# Patient Record
Sex: Male | Born: 1965 | Race: White | Hispanic: No | Marital: Married
Health system: Southern US, Community
[De-identification: ages and names within clinical notes are randomized; demographics above are authoritative.]

## PROBLEM LIST (undated history)

## (undated) DIAGNOSIS — G4700 Insomnia, unspecified: Secondary | ICD-10-CM

## (undated) DIAGNOSIS — G4733 Obstructive sleep apnea (adult) (pediatric): Secondary | ICD-10-CM

## (undated) DIAGNOSIS — F411 Generalized anxiety disorder: Secondary | ICD-10-CM

## (undated) DIAGNOSIS — I10 Essential (primary) hypertension: Secondary | ICD-10-CM

## (undated) DIAGNOSIS — D179 Benign lipomatous neoplasm, unspecified: Secondary | ICD-10-CM

## (undated) HISTORY — DX: Benign lipomatous neoplasm, unspecified: D17.9

## (undated) HISTORY — DX: Generalized anxiety disorder: F41.1

## (undated) HISTORY — DX: Obstructive sleep apnea (adult) (pediatric): G47.33

## (undated) HISTORY — DX: Insomnia, unspecified: G47.00

---

## 2015-08-20 ENCOUNTER — Ambulatory Visit: Payer: Self-pay | Admitting: Cardiovascular Disease

## 2016-06-02 DIAGNOSIS — Z125 Encounter for screening for malignant neoplasm of prostate: Secondary | ICD-10-CM | POA: Diagnosis not present

## 2016-06-02 DIAGNOSIS — I10 Essential (primary) hypertension: Secondary | ICD-10-CM | POA: Diagnosis not present

## 2016-06-02 DIAGNOSIS — R739 Hyperglycemia, unspecified: Secondary | ICD-10-CM | POA: Diagnosis not present

## 2016-06-02 DIAGNOSIS — Z5181 Encounter for therapeutic drug level monitoring: Secondary | ICD-10-CM | POA: Diagnosis not present

## 2016-06-02 DIAGNOSIS — E782 Mixed hyperlipidemia: Secondary | ICD-10-CM | POA: Diagnosis not present

## 2017-06-11 DIAGNOSIS — Z125 Encounter for screening for malignant neoplasm of prostate: Secondary | ICD-10-CM | POA: Diagnosis not present

## 2017-06-11 DIAGNOSIS — Z0000 Encounter for general adult medical examination without abnormal findings: Secondary | ICD-10-CM | POA: Diagnosis not present

## 2017-06-11 DIAGNOSIS — R739 Hyperglycemia, unspecified: Secondary | ICD-10-CM | POA: Diagnosis not present

## 2017-06-11 DIAGNOSIS — I10 Essential (primary) hypertension: Secondary | ICD-10-CM | POA: Diagnosis not present

## 2017-06-11 DIAGNOSIS — E782 Mixed hyperlipidemia: Secondary | ICD-10-CM | POA: Diagnosis not present

## 2017-06-27 DIAGNOSIS — Z1211 Encounter for screening for malignant neoplasm of colon: Secondary | ICD-10-CM | POA: Diagnosis not present

## 2017-07-11 DIAGNOSIS — F411 Generalized anxiety disorder: Secondary | ICD-10-CM | POA: Diagnosis not present

## 2017-12-18 ENCOUNTER — Ambulatory Visit: Payer: Self-pay | Admitting: Medical

## 2017-12-18 ENCOUNTER — Encounter: Payer: Self-pay | Admitting: Medical

## 2017-12-18 VITALS — BP 140/92 | HR 72 | Temp 98.1°F

## 2017-12-18 DIAGNOSIS — I10 Essential (primary) hypertension: Secondary | ICD-10-CM

## 2017-12-18 NOTE — Progress Notes (Signed)
   Subjective:    Patient ID: Robert Hanson, male    DOB: [birth year], 52 y.o.   MRN: [id number]  HPI Taking metoprolol and Lorrin Mais He started  taking a new pill of  differetnt color of his blood pressure medication. He is not sure of the exact name of the medication the history says he is on Metoprolol -HCTZ 50-25 but he does not recall the HCTZ. He says he picked up his medication from his pharmacy and that the pill was a different color, which he was told by the pharmacist that day to expect the change. Since being on the new pill medication, he does not feel right  " like my blood pressure is elevated" and "my fingers feel swollen"  . Today  His bp in clinic was  142/94.denies any sshortness of breath or chest pain, dizziness, visual changes or syncope.    Feels his blood presssure was 130/90 was at his last doctors appointment Refill by [facility] physicians by Robert Hanson, Last saw doctor about 6 months ago.  Stopped Zoloft 6 weeks ago on his own, "I felt it was not working".. Review of Systems  Eyes: Negative for visual disturbance.  Respiratory: Negative for chest tightness and shortness of breath.   Cardiovascular: Negative for chest pain and leg swelling (swelling of his fingers " he could not get his wedding band on today").  Neurological: Negative for dizziness, syncope, light-headedness and headaches.       Objective:   Physical Exam  Constitutional: He is oriented to person, place, and time. He appears well-developed and well-nourished.  HENT:  Head: Normocephalic and atraumatic.  Cardiovascular: Normal rate, regular rhythm and normal heart sounds.  Pulmonary/Chest: Effort normal and breath sounds normal.  Neurological: He is alert and oriented to person, place, and time.  Skin: Skin is warm and dry.  Psychiatric: He has a normal mood and affect. His behavior is normal. Judgment and thought content normal.  Nursing note and vitals reviewed. I cannot appreciate any swelling in  his hands.   Recheck 140/92. Assessment & Plan:  Hypertension, change in pill manufacturer. I called the pharmacist to make sure there had been no change in his medication dose or that the HCTZ was added as a new medication regimen. Robert Hanson at his pharmacy said he had been on the Metoprolol- HCTZ since 2017. That sometimes patients cannot tolerate a change in manufacturer, she recommended he come back to the pharmacy and see if they could get the original manufacturer.  I communicated this to [facility]. Or the other plan is to see his family doctor and discuss his medication with them. He said he did not want to go to the pharmacy because he did not want it to seem like he was complaining , I told him it was the pharmacist who recommended getting the old manufacturer pills.  He says he will folllow up with his family doctor. Recheck on blood pressure is  140/92.

## 2017-12-18 NOTE — Patient Instructions (Signed)
Patient left without seeing me after his second blood pressure recheck.

## 2018-03-01 DIAGNOSIS — H6981 Other specified disorders of Eustachian tube, right ear: Secondary | ICD-10-CM | POA: Diagnosis not present

## 2018-03-01 DIAGNOSIS — H6591 Unspecified nonsuppurative otitis media, right ear: Secondary | ICD-10-CM | POA: Diagnosis not present

## 2018-03-19 DIAGNOSIS — K625 Hemorrhage of anus and rectum: Secondary | ICD-10-CM | POA: Diagnosis not present

## 2018-03-19 DIAGNOSIS — R634 Abnormal weight loss: Secondary | ICD-10-CM | POA: Diagnosis not present

## 2018-03-19 DIAGNOSIS — R197 Diarrhea, unspecified: Secondary | ICD-10-CM | POA: Diagnosis not present

## 2018-05-02 ENCOUNTER — Ambulatory Visit: Payer: Self-pay | Admitting: Adult Health

## 2018-05-02 ENCOUNTER — Encounter: Payer: Self-pay | Admitting: Adult Health

## 2018-05-02 VITALS — BP 151/96 | HR 61 | Temp 97.9°F | Resp 16 | Ht 73.0 in | Wt 197.0 lb

## 2018-05-02 DIAGNOSIS — H6501 Acute serous otitis media, right ear: Secondary | ICD-10-CM

## 2018-05-02 DIAGNOSIS — H6983 Other specified disorders of Eustachian tube, bilateral: Secondary | ICD-10-CM

## 2018-05-02 DIAGNOSIS — H6993 Unspecified Eustachian tube disorder, bilateral: Secondary | ICD-10-CM

## 2018-05-02 DIAGNOSIS — H9311 Tinnitus, right ear: Secondary | ICD-10-CM

## 2018-05-02 DIAGNOSIS — H60501 Unspecified acute noninfective otitis externa, right ear: Secondary | ICD-10-CM

## 2018-05-02 MED ORDER — AMOXICILLIN-POT CLAVULANATE 875-125 MG PO TABS: 1.0000 | ORAL_TABLET | Freq: Two times a day (BID) | ORAL | 0 refills | Status: DC

## 2018-05-02 MED ORDER — NEOMYCIN-POLYMYXIN-HC 3.5-10000-1 OT SOLN: 3.0000 [drp] | Freq: Four times a day (QID) | OTIC | 0 refills | Status: DC

## 2018-05-02 MED ORDER — PREDNISONE 10 MG (21) PO TBPK: ORAL_TABLET | ORAL | 0 refills | Status: DC

## 2018-05-02 NOTE — Patient Instructions (Signed)
Prednisolone tablets What is this medicine? PREDNISOLONE (pred NISS oh lone) is a corticosteroid. It is commonly used to treat inflammation of the skin, joints, lungs, and other organs. Common conditions treated include asthma, allergies, and arthritis. It is also used for other conditions, such as blood disorders and diseases of the adrenal glands. This medicine may be used for other purposes; ask your health care provider or pharmacist if you have questions. COMMON BRAND NAME(S): Millipred, Millipred DP, Millipred DP 12-Day, Millipred DP 6 Day, Prednoral What should I tell my health care provider before I take this medicine? They need to know if you have any of these conditions: -Cushing's syndrome -diabetes -glaucoma -heart problems or disease -high blood pressure -infection such as herpes, measles, tuberculosis, or chickenpox -kidney disease -liver disease -mental problems -myasthenia gravis -osteoporosis -seizures -stomach ulcer or intestine disease including colitis and diverticulitis -thyroid problem -an unusual or allergic reaction to lactose, prednisolone, other medicines, foods, dyes, or preservatives -pregnant or trying to get pregnant -breast-feeding How should I use this medicine? Take this medicine by mouth with a glass of water. Follow the directions on the prescription label. Take it with food or milk to avoid stomach upset. If you are taking this medicine once a day, take it in the morning. Do not take more medicine than you are told to take. Do not suddenly stop taking your medicine because you may develop a severe reaction. Your doctor will tell you how much medicine to take. If your doctor wants you to stop the medicine, the dose may be slowly lowered over time to avoid any side effects. Talk to your pediatrician regarding the use of this medicine in children. Special care may be needed. Overdosage: If you think you have taken too much of this medicine contact a poison  control center or emergency room at once. NOTE: This medicine is only for you. Do not share this medicine with others. What if I miss a dose? If you miss a dose, take it as soon as you can. If it is almost time for your next dose, take only that dose. Do not take double or extra doses. What may interact with this medicine? Do not take this medicine with any of the following medications: -metyrapone -mifepristone This medicine may also interact with the following medications: -aminoglutethimide -amphotericin B -aspirin and aspirin-like medicines -barbiturates -certain medicines for diabetes, like glipizide or glyburide -cholestyramine -cholinesterase inhibitors -cyclosporine -digoxin -diuretics -ephedrine -male hormones, like estrogens and birth control pills -isoniazid -ketoconazole -NSAIDS, medicines for pain and inflammation, like ibuprofen or naproxen -phenytoin -rifampin -toxoids -vaccines -warfarin This list may not describe all possible interactions. Give your health care provider a list of all the medicines, herbs, non-prescription drugs, or dietary supplements you use. Also tell them if you smoke, drink alcohol, or use illegal drugs. Some items may interact with your medicine. What should I watch for while using this medicine? Visit your doctor or health care professional for regular checks on your progress. If you are taking this medicine over a prolonged period, carry an identification card with your name and address, the type and dose of your medicine, and your doctor's name and address. This medicine may increase your risk of getting an infection. Tell your doctor or health care professional if you are around anyone with measles or chickenpox, or if you develop sores or blisters that do not heal properly. If you are going to have surgery, tell your doctor or health care professional that you have taken  this medicine within the last twelve months. Ask your doctor or  health care professional about your diet. You may need to lower the amount of salt you eat. This medicine may affect blood sugar levels. If you have diabetes, check with your doctor or health care professional before you change your diet or the dose of your diabetic medicine. What side effects may I notice from receiving this medicine? Side effects that you should report to your doctor or health care professional as soon as possible: -allergic reactions like skin rash, itching or hives, swelling of the face, lips, or tongue -changes in emotions or moods -changes in vision -eye pain -signs and symptoms of high blood sugar such as dizziness; dry mouth; dry skin; fruity breath; nausea; stomach pain; increased hunger or thirst; increased urination -signs and symptoms of infection like fever or chills; cough; sore throat; pain or trouble passing urine -slow growth in children (if used for longer periods of time) -swelling of ankles, feet -trouble sleeping -unusually weak or tired -weak bones (if used for longer periods of time) Side effects that usually do not require medical attention (report to your doctor or health care professional if they continue or are bothersome): -increased hunger -nausea -skin problems, acne, thin and shiny skin -upset stomach -weight gain This list may not describe all possible side effects. Call your doctor for medical advice about side effects. You may report side effects to FDA at 1-800-FDA-1088. Where should I keep my medicine? Keep out of the reach of children. Store at room temperature between 15 and 30 degrees C (59 and 86 degrees F). Keep container tightly closed. Throw away any unused medicine after the expiration date. NOTE: This sheet is a summary. It may not cover all possible information. If you have questions about this medicine, talk to your doctor, pharmacist, or health care provider.  2018 Elsevier/Gold Standard (2015-08-19  12:30:30) Tinnitus Tinnitus refers to hearing a sound when there is no actual source for that sound. This is often described as ringing in the ears. However, people with this condition may hear a variety of noises. A person may hear the sound in one ear or in both ears. The sounds of tinnitus can be soft, loud, or somewhere in between. Tinnitus can last for a few seconds or can be constant for days. It may go away without treatment and come back at various times. When tinnitus is constant or happens often, it can lead to other problems, such as trouble sleeping and trouble concentrating. Almost everyone experiences tinnitus at some point. Tinnitus that is long-lasting (chronic) or comes back often is a problem that may require medical attention. What are the causes? The cause of tinnitus is often not known. In some cases, it can result from other problems or conditions, including:  Exposure to loud noises from machinery, music, or other sources.  Hearing loss.  Ear or sinus infections.  Earwax buildup.  A foreign object in the ear.  Use of certain medicines.  Use of alcohol and caffeine.  High blood pressure.  Heart diseases.  Anemia.  Allergies.  Meniere disease.  Thyroid problems.  Tumors.  An enlarged part of a weakened blood vessel (aneurysm).  What are the signs or symptoms? The main symptom of tinnitus is hearing a sound when there is no source for that sound. It may sound like:  Buzzing.  Roaring.  Ringing.  Blowing air, similar to the sound heard when you listen to a seashell.  Hissing.  Whistling.  Sizzling.  Humming.  Running water.  A sustained musical note.  How is this diagnosed? Tinnitus is diagnosed based on your symptoms. Your health care provider will do a physical exam. A comprehensive hearing exam (audiologic exam) will be done if your tinnitus:  Affects only one ear (unilateral).  Causes hearing difficulties.  Lasts 6 months or  longer.  You may also need to see a health care provider who specializes in hearing disorders (audiologist). You may be asked to complete a questionnaire to determine the severity of your tinnitus. Tests may be done to help determine the cause and to rule out other conditions. These can include:  Imaging studies of your head and brain, such as: ? A CT scan. ? An MRI.  An imaging study of your blood vessels (angiogram).  How is this treated? Treating an underlying medical condition can sometimes make tinnitus go away. If your tinnitus continues, other treatments may include:  Medicines, such as certain antidepressants or sleeping aids.  Sound generators to mask the tinnitus. These include: ? Tabletop sound machines that play relaxing sounds to help you fall asleep. ? Wearable devices that fit in your ear and play sounds or music. ? A small device that uses headphones to deliver a signal embedded in music (acoustic neural stimulation). In time, this may change the pathways of your brain and make you less sensitive to tinnitus. This device is used for very severe cases when no other treatment is working.  Therapy and counseling to help you manage the stress of living with tinnitus.  Using hearing aids or cochlear implants, if your tinnitus is related to hearing loss.  Follow these instructions at home:  When possible, avoid being in loud places and being exposed to loud sounds.  Wear hearing protection, such as earplugs, when you are exposed to loud noises.  Do not take stimulants, such as nicotine, alcohol, or caffeine.  Practice techniques for reducing stress, such as meditation, yoga, or deep breathing.  Use a white noise machine, a humidifier, or other devices to mask the sound of tinnitus.  Sleep with your head slightly raised. This may reduce the impact of tinnitus.  Try to get plenty of rest each night. Contact a health care provider if:  You have tinnitus in just one  ear.  Your tinnitus continues for 3 weeks or longer without stopping.  Home care measures are not helping.  You have tinnitus after a head injury.  You have tinnitus along with any of the following: ? Dizziness. ? Loss of balance. ? Nausea and vomiting. This information is not intended to replace advice given to you by your health care provider. Make sure you discuss any questions you have with your health care provider. Document Released: 07/17/2005 Document Revised: 03/19/2016 Document Reviewed: 12/17/2013 Elsevier Interactive Patient Education  2018 [place]. Amoxicillin; Clavulanic Acid tablets What is this medicine? AMOXICILLIN; CLAVULANIC ACID (a mox i SIL in; KLAV yoo lan ic AS id) is a penicillin antibiotic. It is used to treat certain kinds of bacterial infections. It will not work for colds, flu, or other viral infections. This medicine may be used for other purposes; ask your health care provider or pharmacist if you have questions. COMMON BRAND NAME(S): Augmentin What should I tell my health care provider before I take this medicine? They need to know if you have any of these conditions: -bowel disease, like colitis -kidney disease -liver disease -mononucleosis -an unusual or allergic reaction to amoxicillin, penicillin, cephalosporin, other  antibiotics, clavulanic acid, other medicines, foods, dyes, or preservatives -pregnant or trying to get pregnant -breast-feeding How should I use this medicine? Take this medicine by mouth with a full glass of water. Follow the directions on the prescription label. Take at the start of a meal. Do not crush or chew. If the tablet has a score line, you may cut it in half at the score line for easier swallowing. Take your medicine at regular intervals. Do not take your medicine more often than directed. Take all of your medicine as directed even if you think you are better. Do not skip doses or stop your medicine early. Talk to your  pediatrician regarding the use of this medicine in children. Special care may be needed. Overdosage: If you think you have taken too much of this medicine contact a poison control center or emergency room at once. NOTE: This medicine is only for you. Do not share this medicine with others. What if I miss a dose? If you miss a dose, take it as soon as you can. If it is almost time for your next dose, take only that dose. Do not take double or extra doses. What may interact with this medicine? -allopurinol -anticoagulants -birth control pills -methotrexate -probenecid This list may not describe all possible interactions. Give your health care provider a list of all the medicines, herbs, non-prescription drugs, or dietary supplements you use. Also tell them if you smoke, drink alcohol, or use illegal drugs. Some items may interact with your medicine. What should I watch for while using this medicine? Tell your doctor or health care professional if your symptoms do not improve. Do not treat diarrhea with over the counter products. Contact your doctor if you have diarrhea that lasts more than 2 days or if it is severe and watery. If you have diabetes, you may get a false-positive result for sugar in your urine. Check with your doctor or health care professional. Birth control pills may not work properly while you are taking this medicine. Talk to your doctor about using an extra method of birth control. What side effects may I notice from receiving this medicine? Side effects that you should report to your doctor or health care professional as soon as possible: -allergic reactions like skin rash, itching or hives, swelling of the face, lips, or tongue -breathing problems -dark urine -fever or chills, sore throat -redness, blistering, peeling or loosening of the skin, including inside the mouth -seizures -trouble passing urine or change in the amount of urine -unusual bleeding,  bruising -unusually weak or tired -white patches or sores in the mouth or throat Side effects that usually do not require medical attention (report to your doctor or health care professional if they continue or are bothersome): -diarrhea -dizziness -headache -nausea, vomiting -stomach upset -vaginal or anal irritation This list may not describe all possible side effects. Call your doctor for medical advice about side effects. You may report side effects to FDA at 1-800-FDA-1088. Where should I keep my medicine? Keep out of the reach of children. Store at room temperature below 25 degrees C (77 degrees F). Keep container tightly closed. Throw away any unused medicine after the expiration date. NOTE: This sheet is a summary. It may not cover all possible information. If you have questions about this medicine, talk to your doctor, pharmacist, or health care provider.  2018 Elsevier/Gold Standard (2007-10-10 12:04:30)

## 2018-05-02 NOTE — Progress Notes (Signed)
Subjective:     Patient ID: Robert Hanson, male   DOB: 1966/03/12, 52 y.o.   MRN: [id number]  HPI  Blood pressure (!) 151/96, pulse 61, temperature 97.9 F (36.6 C), resp. rate 16, height 6\' 1"  (1.854 m), weight 197 lb (89.4 kg), SpO2 97 %. Recheck B/P 140/84 manual.   Patient is a 52 year old male in no acute distress who comes in to the clnic for tinniytus - he reports he was seen by his primary care doctor 6 weeks ago for tinnitus and was told he had fluid behind right ear. Her reports he was given Prednisone 40 mg for 5 days on 03/01/18 and reports symptoms never really resolved. He sees [place] physcians Maurice Small in [place] he reports.   Denies dizziness. Tinnitus in right ear only.  Denies any pain.  Denies any headaches.  He is using Flonase. He is not using an antihistamine.  Denies any loss of hearing.   Patient  denies any fever, body aches,chills, rash, chest pain, shortness of breath, nausea, vomiting, or diarrhea.    Review of Systems  Constitutional: Negative.   HENT: Positive for congestion, ear pain (right / ringing in right ear only ), rhinorrhea and tinnitus (right ear ). Negative for dental problem, drooling, ear discharge, facial swelling, hearing loss, mouth sores, nosebleeds, postnasal drip, sinus pressure, sinus pain, sneezing, sore throat, trouble swallowing and voice change.   Eyes: Negative.   Respiratory: Negative.   Cardiovascular: Negative.   Gastrointestinal: Negative.   Endocrine: Negative.   Genitourinary: Negative.   Musculoskeletal: Negative.   Skin: Negative.   Allergic/Immunologic: Positive for environmental allergies. Negative for food allergies and immunocompromised state.  Neurological: Negative.   Hematological: Negative.   Psychiatric/Behavioral: Negative.        Objective:   Physical Exam  Constitutional: He is oriented to person, place, and time. He appears well-developed and well-nourished. He is active.  HENT:  Head:  Normocephalic and atraumatic.  Right Ear: External ear normal. No tenderness. Tympanic membrane is not perforated and not erythematous. A middle ear effusion is present.  Left Ear: Hearing, external ear and ear canal normal. There is swelling (mild edema and erythema in lower canal.) and tenderness (mild tenderness with tragal pull ). No foreign bodies. Tympanic membrane is erythematous. Tympanic membrane is not perforated. A middle ear effusion (dark yellow brown fluid behind dull tympanic membrabe right ear) is present.  Nose: Nose normal.  Mouth/Throat: Oropharynx is clear and moist. No oropharyngeal exudate.  Eyes: Pupils are equal, round, and reactive to light. Conjunctivae and EOM are normal.  Neck: Trachea normal, normal range of motion, full passive range of motion without pain and phonation normal. Neck supple. Normal carotid pulses, no hepatojugular reflux and no JVD present. No tracheal tenderness present. Carotid bruit is not present. No Brudzinski's sign noted. No thyroid mass present.  Cardiovascular: Normal rate, regular rhythm, normal heart sounds, intact distal pulses and normal pulses. Exam reveals no gallop and no friction rub.  No murmur heard. Pulmonary/Chest: Effort normal and breath sounds normal. No stridor. No respiratory distress. He has no wheezes. He has no rales. He exhibits no tenderness.  Abdominal: Soft.  Lymphadenopathy:       Head (right side): No submental, no submandibular, no tonsillar, no preauricular, no posterior auricular and no occipital adenopathy present.       Head (left side): No submental, no submandibular, no tonsillar, no preauricular, no posterior auricular and no occipital adenopathy present.    He  has no cervical adenopathy.  Neurological: He is alert and oriented to person, place, and time. He has normal strength and normal reflexes. He displays normal reflexes. No cranial nerve deficit or sensory deficit. He exhibits normal muscle tone. He displays  a negative Romberg sign. Coordination normal. GCS eye subscore is 4. GCS verbal subscore is 5. GCS motor subscore is 6.  Patient is alert and oriented and responsive to questions Engages in eye contact with provider. Speaks in full sentences without any pauses without any shortness of breath or distress.   Patient moves on and off of exam table and in room without difficulty. Gait is normal in hall and in room. Patient is oriented to person place time and situation. Patient answers questions appropriately and engages in conversation.   Skin: Skin is warm and dry. Capillary refill takes less than 2 seconds. No rash noted. No erythema. No pallor.  Psychiatric: He has a normal mood and affect. His behavior is normal. Judgment and thought content normal.  Vitals reviewed.      Assessment:    Eustachian tube dysfunction, bilateral  Non-recurrent acute serous otitis media of right ear  Acute otitis externa of right ear, unspecified type  Tinnitus of right ear    Plan:     Recheck in two weeks sooner if worsens or does not improve. May need referral to Ear nose and throat. Symptoms should not persist and should completely resolve.  After visit summary reviewed with patient.   Monitor blood pressure at home and record/ follow up with record to primary care physician.Maurice Small, MD   Advised patient call the office or your primary care doctor for an appointment if no improvement within 72 hours or if any symptoms change or worsen at any time  Advised ER or urgent Care if after hours or on weekend. Call 911 for emergency symptoms at any time.Patinet verbalized understanding of all instructions given/reviewed and treatment plan and has no further questions or concerns at this time.    Patient verbalized understanding of all instructions given and denies any further questions at this time.

## 2018-06-26 DIAGNOSIS — R739 Hyperglycemia, unspecified: Secondary | ICD-10-CM | POA: Diagnosis not present

## 2018-06-26 DIAGNOSIS — Z0000 Encounter for general adult medical examination without abnormal findings: Secondary | ICD-10-CM | POA: Diagnosis not present

## 2018-06-26 DIAGNOSIS — Z125 Encounter for screening for malignant neoplasm of prostate: Secondary | ICD-10-CM | POA: Diagnosis not present

## 2018-06-26 DIAGNOSIS — E782 Mixed hyperlipidemia: Secondary | ICD-10-CM | POA: Diagnosis not present

## 2018-06-26 DIAGNOSIS — Z23 Encounter for immunization: Secondary | ICD-10-CM | POA: Diagnosis not present

## 2018-06-26 DIAGNOSIS — I10 Essential (primary) hypertension: Secondary | ICD-10-CM | POA: Diagnosis not present

## 2018-06-26 DIAGNOSIS — Z1211 Encounter for screening for malignant neoplasm of colon: Secondary | ICD-10-CM | POA: Diagnosis not present

## 2019-01-21 DIAGNOSIS — Z20828 Contact with and (suspected) exposure to other viral communicable diseases: Secondary | ICD-10-CM | POA: Diagnosis not present

## 2019-01-21 DIAGNOSIS — R05 Cough: Secondary | ICD-10-CM | POA: Diagnosis not present

## 2019-01-22 DIAGNOSIS — R05 Cough: Secondary | ICD-10-CM | POA: Diagnosis not present

## 2019-02-25 DIAGNOSIS — R05 Cough: Secondary | ICD-10-CM | POA: Diagnosis not present

## 2019-02-26 ENCOUNTER — Other Ambulatory Visit: Payer: Self-pay | Admitting: Family Medicine

## 2019-02-26 ENCOUNTER — Ambulatory Visit
Admission: RE | Admit: 2019-02-26 | Discharge: 2019-02-26 | Disposition: A | Discharge status: Home / Self Care | Payer: BC Managed Care – PPO | Source: Ambulatory Visit | Attending: Family Medicine | Admitting: Family Medicine

## 2019-02-26 DIAGNOSIS — R05 Cough: Secondary | ICD-10-CM | POA: Diagnosis not present

## 2019-02-26 DIAGNOSIS — R0600 Dyspnea, unspecified: Secondary | ICD-10-CM

## 2019-02-26 DIAGNOSIS — R0609 Other forms of dyspnea: Secondary | ICD-10-CM

## 2019-02-26 DIAGNOSIS — R0602 Shortness of breath: Secondary | ICD-10-CM | POA: Diagnosis not present

## 2019-03-06 ENCOUNTER — Other Ambulatory Visit: Payer: Self-pay

## 2019-03-06 ENCOUNTER — Ambulatory Visit (INDEPENDENT_AMBULATORY_CARE_PROVIDER_SITE_OTHER): Payer: BC Managed Care – PPO

## 2019-03-06 ENCOUNTER — Ambulatory Visit: Payer: BC Managed Care – PPO | Admitting: Internal Medicine

## 2019-03-06 ENCOUNTER — Encounter: Payer: Self-pay | Admitting: Internal Medicine

## 2019-03-06 DIAGNOSIS — R05 Cough: Secondary | ICD-10-CM | POA: Diagnosis not present

## 2019-03-06 DIAGNOSIS — R058 Other specified cough: Secondary | ICD-10-CM

## 2019-03-06 MED ORDER — PREDNISONE 10 MG PO TABS: ORAL_TABLET | ORAL | 0 refills | Status: DC

## 2019-03-06 MED ORDER — ACETAMINOPHEN-CODEINE #3 300-30 MG PO TABS: 1.0000 | ORAL_TABLET | ORAL | 0 refills | Status: DC | PRN

## 2019-03-06 NOTE — Progress Notes (Signed)
Robert Hanson, male    DOB: August 08, 1965,    MRN: [id number]   Brief patient profile:  53 yowm minimal smoking hx quit  2018  With h/o bad sinus infections onset or arrival 2003 to [place] better since started p rx flonase and chronic cough related to smoking that resolved 100% p quit then end of May 2020 started back coughing gagging / choking and vomiting with mucus white min prod > cxr suggested pna while augmentin > then changed to levaquin 02/28/2019  And referred to pulmonary clinic 03/06/2019 by Dr  Maurice Small      History of Present Illness  03/06/2019  Pulmonary/ 1st office eval/Sonia Stickels  Chief Complaint  Patient presents with  . Pulmonary Consult    Referred by Dr. Justin Mend for cough. Patient reports a productive cough with thick white sputum. He also reports that he has some wheezing at night when he lays down to go to sleep.   Dyspnea:  Walking the dog  Cough: wakes up usual time and then starts couging, worse again at  hs some better on rx  Sleep: does fine when asleep SABA use: proventil one puff every 4 hours  No obvious day to day or daytime variability or assoc excess/ purulent sputum or mucus plugs or hemoptysis or cp or chest tightness,  overt sinus or hb symptoms.   Sleeping flat and does fine once asleep  without nocturnal  or early am exacerbation  of respiratory  c/o's or need for noct saba. Also denies any obvious fluctuation of symptoms with weather or environmental changes or other aggravating or alleviating factors except as outlined above   No unusual exposure hx or h/o childhood pna/ asthma or knowledge of premature birth.  Current Allergies, Complete Past Medical History, Past Surgical History, Family History, and Social History were reviewed in [employer] record.  ROS  The following are not active complaints unless bolded Hoarseness, sore throat, dysphagia, dental problems, itching, sneezing,  nasal congestion or discharge of excess mucus or  purulent secretions, ear ache,   fever, chills, sweats, unintended wt loss or wt gain, classically pleuritic or exertional cp,  orthopnea pnd or arm/hand swelling  or leg swelling, presyncope, palpitations, abdominal pain, anorexia, nausea, vomiting, diarrhea  or change in bowel habits or change in bladder habits, change in stools or change in urine, dysuria, hematuria,  rash, arthralgias, visual complaints, headache, numbness, weakness or ataxia or problems with walking or coordination,  change in mood or  memory.           History reviewed. No pertinent past medical history.  Outpatient Medications Prior to Visit  Medication Sig Dispense Refill  . levofloxacin (LEVAQUIN) 500 MG tablet TAKE 1 TABLET BY MOUTH EVERY DAY FOR 10 DAYS    . metoprolol-hydrochlorothiazide (LOPRESSOR HCT) 50-25 MG tablet Take 1 tablet by mouth daily.  3  . sertraline (ZOLOFT) 100 MG tablet TAKE 1 AND 1/2 TABLETS BY MOUTH ONCE A DAY.    Marland Kitchen zolpidem (AMBIEN) 5 MG tablet 5 mg at bedtime.   4  .       . neomycin-polymyxin-hydrocortisone (CORTISPORIN) OTIC solution Place 3 drops into the right ear 4 (four) times daily. 10 mL 0  .     0      Objective:     BP 120/76 (BP Location: Left Arm, Cuff Size: Normal)   Pulse 66   Temp 97.7 F (36.5 C) (Oral)   Ht 6\' 1"  (1.854 m)  Wt 197 lb (89.4 kg)   SpO2 95%   BMI 25.99 kg/m   SpO2: 95 %  RA  amb wm nad   HEENT: nl dentition, turbinates bilaterally, and oropharynx. Nl external ear canals without cough reflex   NECK :  without JVD/Nodes/TM/ nl carotid upstrokes bilaterally   LUNGS: no acc muscle use,  Nl contour chest which is clear to A and P bilaterally without cough on insp or exp maneuvers   CV:  RRR  no s3 or murmur or increase in P2, and no edema   ABD:  soft and nontender with nl inspiratory excursion in the supine position. No bruits or organomegaly appreciated, bowel sounds nl  MS:  Nl gait/ ext warm without deformities, calf tenderness, cyanosis  or clubbing No obvious joint restrictions   SKIN: warm and dry without lesions    NEURO:  alert, approp, nl sensorium with  no motor or cerebellar deficits apparent.    CXR PA and Lateral:   03/06/2019 :    I personally reviewed images and agree with radiology impression as follows:   No active cardiopulmonary disease. My review:  I do not see convincing as dz on prior study 02/26/19 and suspect the scapula shadow is what is being seen esp on lateral view      Assessment   Upper airway cough syndrome Onset May 2020 cough so hards he gag/vomits  -  ? pna on cxr 02/26/19 rx augmenin then levaquin > 100% gone 03/07/2019 and suspect this was scapular shadow esp on lateral view - cyclical cough rx 04/02/2354 >>>  Certainly he has not had pna since May and have no problem with him finishing his abx but cough pattern is typical of Upper airway cough syndrome (previously labeled PNDS),  is so named because it's frequently impossible to sort out how much is  CR/sinusitis with freq throat clearing (which can be related to primary GERD)   vs  causing  secondary (" extra esophageal")  GERD from wide swings in gastric pressure that occur with throat clearing, often  promoting self use of mint and menthol lozenges that reduce the lower esophageal sphincter tone and exacerbate the problem further in a cyclical fashion(see below)    These are the same pts (now being labeled as having "irritable larynx syndrome" by some cough centers) who not infrequently have a history of having failed to tolerate ace inhibitors,  dry powder inhalers or biphosphonates or report having atypical/extraesophageal reflux symptoms that don't respond to standard doses of PPI  and are easily confused as having aecopd or asthma flares by even experienced allergists/ pulmonologists (myself included).    Re cyclical cough mentioned above:  Of the three most common causes of  Sub-acute / recurrent or chronic cough, only one (GERD)  can  actually contribute to/ trigger  the other two (asthma and post nasal drip syndrome)  and perpetuate the cylce of cough.  While not intuitively obvious, many patients with chronic low grade reflux do not cough until there is a primary insult that disturbs the protective epithelial barrier and exposes sensitive nerve endings.   This is typically viral but can due to PNDS and  either may apply here.      >>> The point is that once this occurs, it is difficult to eliminate the cycle  using anything but a maximally effective acid suppression regimen at least in the short run, accompanied by an appropriate diet to address non acid GERD and control /  eliminate the cough itself for at least 3 days with tylenol #3 plus given 6 days of Prednisone in case of component of Th-2 driven upper or lower airways inflammation (if cough responds short term only to relapse befor return while will on rx for uacs that would point to allergic rhinitis/ asthma or eos bronchitis)     >> f/u in 2 weeks with all meds in hand using a trust but verify approach to confirm accurate Medication  Reconciliation The principal here is that until we are certain that the  patients are doing what we've asked, it makes no sense to ask them to do more.    Total time devoted to counseling  > 50 % of initial 60 min office visit:  review case with pt/ discussion of options/alternatives/ personally creating written customized instructions  in presence of pt  then going over those specific  Instructions directly with the pt including how to use all of the meds but in particular covering each new medication in detail and the difference between the maintenance= "automatic" meds and the prns using an action plan format for the latter (If this problem/symptom => do that organization reading Left to right).  Please see AVS from this visit for a full list of these instructions which I personally wrote for this pt and  are unique to this visit.       Christinia Gully, MD 03/06/2019

## 2019-03-06 NOTE — Patient Instructions (Addendum)
Finish levaquin as you plan   Take delsym two tsp every 12 hours and supplement if needed with  Tylenol #3   up to 1-2 every 4 hours to suppress the urge to cough. Swallowing water and/or using ice chips/non mint and menthol containing candies (such as lifesavers or sugarless jolly ranchers) are also effective.  You should rest your voice and avoid activities that you know make you cough.  Once you have eliminated the cough for 3 straight days try reducing the Tylenol #3 first,  then the delsym as tolerated.     Prednisone 10 mg take  4 each am x 2 days,   2 each am x 2 days,  1 each am x 2 days and stop    Please remember to go to the lab and x-ray department   for your tests - we will call you with the results when they are available.     Please schedule a follow up office visit in 2 weeks, sooner if needed  with all medications /inhalers/ solutions in hand so we can verify exactly what you are taking. This includes all medications from all doctors and over the counters

## 2019-03-07 ENCOUNTER — Telehealth: Payer: Self-pay | Admitting: Internal Medicine

## 2019-03-07 ENCOUNTER — Encounter: Payer: Self-pay | Admitting: Internal Medicine

## 2019-03-07 LAB — CBC WITH DIFFERENTIAL/PLATELET
Basophils Absolute: 0.1 10*3/uL (ref 0.0–0.1)
Basophils Relative: 0.7 % (ref 0.0–3.0)
Eosinophils Absolute: 0.1 10*3/uL (ref 0.0–0.7)
Eosinophils Relative: 1.7 % (ref 0.0–5.0)
HCT: 49.5 % (ref 39.0–52.0)
Hemoglobin: 16.9 g/dL (ref 13.0–17.0)
Lymphocytes Relative: 22.3 % (ref 12.0–46.0)
Lymphs Abs: 1.8 10*3/uL (ref 0.7–4.0)
MCHC: 34.1 g/dL (ref 30.0–36.0)
MCV: 97.9 fl (ref 78.0–100.0)
Monocytes Absolute: 0.6 10*3/uL (ref 0.1–1.0)
Monocytes Relative: 7.8 % (ref 3.0–12.0)
Neutro Abs: 5.4 10*3/uL (ref 1.4–7.7)
Neutrophils Relative %: 67.5 % (ref 43.0–77.0)
Platelets: 192 10*3/uL (ref 150.0–400.0)
RBC: 5.05 Mil/uL (ref 4.22–5.81)
RDW: 14.1 % (ref 11.5–15.5)
WBC: 8 10*3/uL (ref 4.0–10.5)

## 2019-03-07 LAB — SEDIMENTATION RATE: Sed Rate: 6 mm/hr (ref 0–20)

## 2019-03-07 NOTE — Progress Notes (Signed)
ATC, NA and VM not set up yet

## 2019-03-07 NOTE — Assessment & Plan Note (Addendum)
Onset May 2020 cough so hards he gag/vomits  -  ? pna on cxr 02/26/19 rx augmenin then levaquin > 100% gone 03/07/2019 and suspect this was scapular shadow esp on lateral view - cyclical cough rx 0/03/3234 >>>  Certainly he has not had pna since May and have no problem with him finishing his abx but cough pattern is typical of Upper airway cough syndrome (previously labeled PNDS),  is so named because it's frequently impossible to sort out how much is  CR/sinusitis with freq throat clearing (which can be related to primary GERD)   vs  causing  secondary (" extra esophageal")  GERD from wide swings in gastric pressure that occur with throat clearing, often  promoting self use of mint and menthol lozenges that reduce the lower esophageal sphincter tone and exacerbate the problem further in a cyclical fashion(see below)    These are the same pts (now being labeled as having "irritable larynx syndrome" by some cough centers) who not infrequently have a history of having failed to tolerate ace inhibitors,  dry powder inhalers or biphosphonates or report having atypical/extraesophageal reflux symptoms that don't respond to standard doses of PPI  and are easily confused as having aecopd or asthma flares by even experienced allergists/ pulmonologists (myself included).    Re cyclical cough mentioned above:  Of the three most common causes of  Sub-acute / recurrent or chronic cough, only one (GERD)  can actually contribute to/ trigger  the other two (asthma and post nasal drip syndrome)  and perpetuate the cylce of cough.  While not intuitively obvious, many patients with chronic low grade reflux do not cough until there is a primary insult that disturbs the protective epithelial barrier and exposes sensitive nerve endings.   This is typically viral but can due to PNDS and  either may apply here.      >>> The point is that once this occurs, it is difficult to eliminate the cycle  using anything but a maximally  effective acid suppression regimen at least in the short run, accompanied by an appropriate diet to address non acid GERD and control / eliminate the cough itself for at least 3 days with tylenol #3 plus given 6 days of Prednisone in case of component of Th-2 driven upper or lower airways inflammation (if cough responds short term only to relapse befor return while will on rx for uacs that would point to allergic rhinitis/ asthma or eos bronchitis)     >> f/u in 2 weeks with all meds in hand using a trust but verify approach to confirm accurate Medication  Reconciliation The principal here is that until we are certain that the  patients are doing what we've asked, it makes no sense to ask them to do more.    Total time devoted to counseling  > 50 % of initial 60 min office visit:  review case with pt/ discussion of options/alternatives/ personally creating written customized instructions  in presence of pt  then going over those specific  Instructions directly with the pt including how to use all of the meds but in particular covering each new medication in detail and the difference between the maintenance= "automatic" meds and the prns using an action plan format for the latter (If this problem/symptom => do that organization reading Left to right).  Please see AVS from this visit for a full list of these instructions which I personally wrote for this pt and  are unique to this visit.

## 2019-03-07 NOTE — Telephone Encounter (Signed)
Notes recorded by Tanda Rockers, MD on 03/07/2019 at 5:41 AM EDT  Call pt: Reviewed cxr and no acute change so no change in recommendations made at [facility] with pt and notified of results per Dr. Melvyn Novas. Pt verbalized understanding and denied any questions.

## 2019-03-07 NOTE — Progress Notes (Signed)
Spoke with pt and notified of results per Dr. Wert. Pt verbalized understanding and denied any questions. 

## 2019-03-10 LAB — RESPIRATORY ALLERGY PROFILE REGION II ~~LOC~~
Allergen, A. alternata, m6: <0.1 kU/L
Allergen, Cedar tree, t12: <0.1 kU/L
Allergen, Comm Silver Birch, t9: <0.1 kU/L
Allergen, Cottonwood, t14: <0.1 kU/L
Allergen, D pternoyssinus,d7: <0.1 kU/L
Allergen, Mouse Urine Protein, e78: 0.49 kU/L — ABNORMAL HIGH
Allergen, Mulberry, t76: <0.1 kU/L
Allergen, Oak,t7: 0.3 kU/L — ABNORMAL HIGH
Allergen, P. notatum, m1: <0.1 kU/L
Aspergillus fumigatus, m3: <0.1 kU/L
Bermuda Grass: <0.1 kU/L
Box Elder IgE: <0.1 kU/L
CLADOSPORIUM HERBARUM (M2) IGE: <0.1 kU/L
COMMON RAGWEED (SHORT) (W1) IGE: <0.1 kU/L
Cat Dander: <0.1 kU/L
Class: 0
Class: 0
Class: 0
Class: 0
Class: 0
Class: 0
Class: 0
Class: 0
Class: 0
Class: 0
Class: 0
Class: 0
Class: 0
Class: 0
Class: 0
Class: 0
Class: 0
Class: 0
Class: 0
Class: 0
Class: 0
Class: 1
Class: 1
Class: 2
Cockroach: 0.33 kU/L — ABNORMAL HIGH
D. farinae: <0.1 kU/L
Dog Dander: 0.94 kU/L — ABNORMAL HIGH
Elm IgE: 0.38 kU/L — ABNORMAL HIGH
IgE (Immunoglobulin E), Serum: 421 kU/L — ABNORMAL HIGH (ref <=114)
Johnson Grass: <0.1 kU/L
Pecan/Hickory Tree IgE: <0.1 kU/L
Rough Pigweed  IgE: <0.1 kU/L
Sheep Sorrel IgE: <0.1 kU/L
Timothy Grass: <0.1 kU/L

## 2019-03-10 LAB — QUANTIFERON-TB GOLD PLUS
Mitogen-NIL: >10 IU/mL
NIL: 0.06 IU/mL
QuantiFERON-TB Gold Plus: NEGATIVE
TB1-NIL: <0 IU/mL
TB2-NIL: 0.01 IU/mL

## 2019-03-10 LAB — INTERPRETATION:

## 2019-03-10 NOTE — Progress Notes (Signed)
ATC, NA and no VM set up yet

## 2019-03-11 ENCOUNTER — Telehealth: Payer: Self-pay | Admitting: Internal Medicine

## 2019-03-11 NOTE — Telephone Encounter (Signed)
Called and spoke with Patient.  Dr Melvyn Novas results and recommendations given.  Understanding stated.  Patient scheduled with Dr Melvyn Novas 03/21/19, for follow up.  Nothing further at this time.

## 2019-03-21 ENCOUNTER — Ambulatory Visit: Payer: BC Managed Care – PPO | Admitting: Internal Medicine

## 2019-03-21 ENCOUNTER — Other Ambulatory Visit: Payer: Self-pay

## 2019-03-21 ENCOUNTER — Encounter: Payer: Self-pay | Admitting: Internal Medicine

## 2019-03-21 DIAGNOSIS — J3081 Allergic rhinitis due to animal (cat) (dog) hair and dander: Secondary | ICD-10-CM | POA: Diagnosis not present

## 2019-03-21 DIAGNOSIS — R05 Cough: Secondary | ICD-10-CM

## 2019-03-21 DIAGNOSIS — R058 Other specified cough: Secondary | ICD-10-CM

## 2019-03-21 NOTE — Patient Instructions (Signed)
Continue flonase at least one at bedtime and if condition worsens up 2 puffs every 12 hours as needed  Eustachian tube dysfunction    For drainage > allegra or clariton otc   If not happy I can call you in a trial of singulair 10 mg each pm    If still having trouble I recommend you see an allergist but in meantime should minimize exposure to the dog in the bedroom.   Follow up is as needed

## 2019-03-21 NOTE — Progress Notes (Addendum)
Robert Hanson, male    DOB: 05-25-1966,    MRN: [id number]   Brief patient profile:  53 yowm minimal smoking hx quit  2018  With h/o bad sinus infections onset or arrival 2003 to [place] better since started p rx flonase and chronic cough related to smoking that resolved 100% p quit then end of May 2020 started back coughing gagging / choking and vomiting with mucus white min prod > cxr suggested pna while augmentin > then changed to levaquin 02/28/2019  And referred to pulmonary clinic 03/06/2019 by Dr  Maurice Small      History of Present Illness  03/06/2019  Pulmonary/ 1st office eval/Roy Snuffer  Chief Complaint  Patient presents with  . Pulmonary Consult    Referred by Dr. Justin Mend for cough. Patient reports a productive cough with thick white sputum. He also reports that he has some wheezing at night when he lays down to go to sleep.   Dyspnea:  Walking the dog  Cough: wakes up usual time and then starts coughing, worse again at  hs some better on rx  Sleep: does fine when asleep SABA use: proventil one puff every 4 hours Rec Finish levaquin as you plan  Take delsym two tsp every 12 hours and supplement if needed with  Tylenol #3   up to 1-2 every 4 hours to suppress the urge to cough. Once you have eliminated the cough for 3 straight days try reducing the Tylenol #3 first,  then the delsym as tolerated.    Prednisone 10 mg take  4 each am x 2 days,   2 each am x 2 days,  1 each am x 2 days and stop  Please remember to go to the lab and x-ray department   for your tests - we will call you with the results when they are available.  Please schedule a follow up office visit in 2 weeks, sooner if needed  with all medications /inhalers/ solutions in hand so we can verify exactly what you are taking. This includes all medications from all doctors and over the counters    03/21/2019  f/u ov/Cleatus Goodin re: uacs/ allergic rhinitis  Chief Complaint  Patient presents with  . Follow-up    Patient reports  that he is doing great  Dyspnea:  Not limited by breathing from desired activities  / up and down steps, tennis Cough: gone Sleeping: ok  SABA use: no 02: no    No obvious day to day or daytime variability or assoc excess/ purulent sputum or mucus plugs or hemoptysis or cp or chest tightness, subjective wheeze or overt sinus or hb symptoms.   Sleeping  without nocturnal  or early am exacerbation  of respiratory  c/o's or need for noct saba. Also denies any obvious fluctuation of symptoms with weather or environmental changes or other aggravating or alleviating factors except as outlined above   No unusual exposure hx or h/o childhood pna/ asthma or knowledge of premature birth.  Current Allergies, Complete Past Medical History, Past Surgical History, Family History, and Social History were reviewed in [employer] record.  ROS  The following are not active complaints unless bolded Hoarseness, sore throat, dysphagia, dental problems, itching, sneezing,  nasal congestion or discharge of excess mucus or purulent secretions, ear aches,   fever, chills, sweats, unintended wt loss or wt gain, classically pleuritic or exertional cp,  orthopnea pnd or arm/hand swelling  or leg swelling, presyncope, palpitations, abdominal pain, anorexia, nausea,  vomiting, diarrhea  or change in bowel habits or change in bladder habits, change in stools or change in urine, dysuria, hematuria,  rash, arthralgias both ankles from levaquin resolved , visual complaints, headache, numbness, weakness or ataxia or problems with walking or coordination,  change in mood or  memory.        Current Meds  Medication Sig  . metoprolol-hydrochlorothiazide (LOPRESSOR HCT) 50-25 MG tablet Take 1 tablet by mouth daily.  . sertraline (ZOLOFT) 100 MG tablet TAKE 1 AND 1/2 TABLETS BY MOUTH ONCE A DAY.  Marland Kitchen zolpidem (AMBIEN) 5 MG tablet 5 mg at bedtime.              Objective:      amb pleasant wm nad/ no  throat clearing    Wt Readings from Last 3 Encounters:  03/21/19 194 lb 12.8 oz (88.4 kg)  03/06/19 197 lb (89.4 kg)  05/02/18 197 lb (89.4 kg)     Vital signs reviewed - Note on arrival 02 sats  96% on RA   HEENT: nl dentition, turbinates bilaterally, and oropharynx. Nl external ear canals without cough reflex  NECK :  without JVD/Nodes/TM/ nl carotid upstrokes bilaterally   LUNGS: no acc muscle use,  Nl contour chest which is clear to A and P bilaterally without cough on insp or exp maneuvers   CV:  RRR  no s3 or murmur or increase in P2, and no edema   ABD:  soft and nontender with nl inspiratory excursion in the supine position. No bruits or organomegaly appreciated, bowel sounds nl  MS:  Nl gait/ ext warm without deformities, calf tenderness, cyanosis or clubbing No obvious joint restrictions   SKIN: warm and dry without lesions    NEURO:  alert, approp, nl sensorium with  no motor or cerebellar deficits apparent.               Assessment   Outpatient Encounter Medications as of 03/21/2019  Medication Sig  . metoprolol-hydrochlorothiazide (LOPRESSOR HCT) 50-25 MG tablet Take 1 tablet by mouth daily.  . sertraline (ZOLOFT) 100 MG tablet TAKE 1 AND 1/2 TABLETS BY MOUTH ONCE A DAY.  Marland Kitchen zolpidem (AMBIEN) 5 MG tablet 5 mg at bedtime.   . [DISCONTINUED] acetaminophen-codeine (TYLENOL #3) 300-30 MG tablet Take 1 tablet by mouth every 4 (four) hours as needed (cough).  . [DISCONTINUED] levofloxacin (LEVAQUIN) 500 MG tablet TAKE 1 TABLET BY MOUTH EVERY DAY FOR 10 DAYS  . [DISCONTINUED] predniSONE (DELTASONE) 10 MG tablet Take  4 each am x 2 days,   2 each am x 2 days,  1 each am x 2 days and stop        Plus prn clariton/allegra  otc

## 2019-03-22 ENCOUNTER — Encounter: Payer: Self-pay | Admitting: Internal Medicine

## 2019-03-22 DIAGNOSIS — J3081 Allergic rhinitis due to animal (cat) (dog) hair and dander: Secondary | ICD-10-CM | POA: Insufficient documentation

## 2019-03-22 NOTE — Assessment & Plan Note (Signed)
Onset May 2020 cough so hards he gag/vomits  -  ? pna on cxr 02/26/19 rx augmenin then levaquin > 100% resolved 03/07/2019 and suspect this was scapular shadow esp on lateral view - Quant Gold TB  03/06/2019 neg - Allergy profile 03/06/19 >  Eos 0.1 /  IgE 421 Dog, roach, elm /oak  - cyclical cough rx [id number] >>> resolved 03/21/2019   Resolved but still bothered by underlying allergic rhinitis (see separate a/p)   No evidence of asthma so no need for regular pulmonary f/u

## 2019-03-22 NOTE — Assessment & Plan Note (Addendum)
Allergy profile 03/06/19 >  Eos 0.1 /  IgE 421 Dog, roach, elm /oak   Reviewed findings/ need to keep dog out of bedroom - rx as of 03/21/2019 flonase, can add singulair for breakthru or refer to allergy next   Complicated also by mild Eustachian tube dysfunction  Advised extensively on approp use of otcs including correct applications of nasal steroids emphasizing the spray doesn't work if it does not reach the target tissue "like cream on a rash"   I had an extended discussion with the patient reviewing all relevant studies completed to date and  lasting 15 to 20 minutes of a 25 minute final summary office  visit    I performed detailed device teaching using a teach back method which extended face to face time for this visit (see above)  Each maintenance medication was reviewed in detail including emphasizing most importantly the difference between maintenance and prns and under what circumstances the prns are to be triggered using an action plan format that is not reflected in the computer generated alphabetically organized AVS which I have not found useful in most complex patients, especially with respiratory illnesses  Please see AVS for specific instructions unique to this visit that I personally wrote and verbalized to the the pt in detail and then reviewed with pt  by my nurse highlighting any  changes in therapy recommended at today's visit to their plan of care.

## 2019-07-17 DIAGNOSIS — Z0000 Encounter for general adult medical examination without abnormal findings: Secondary | ICD-10-CM | POA: Diagnosis not present

## 2020-02-13 DIAGNOSIS — H61102 Unspecified noninfective disorders of pinna, left ear: Secondary | ICD-10-CM | POA: Diagnosis not present

## 2020-03-09 DIAGNOSIS — L570 Actinic keratosis: Secondary | ICD-10-CM | POA: Diagnosis not present

## 2020-03-09 DIAGNOSIS — L814 Other melanin hyperpigmentation: Secondary | ICD-10-CM | POA: Diagnosis not present

## 2020-03-09 DIAGNOSIS — L820 Inflamed seborrheic keratosis: Secondary | ICD-10-CM | POA: Diagnosis not present

## 2020-03-09 DIAGNOSIS — L821 Other seborrheic keratosis: Secondary | ICD-10-CM | POA: Diagnosis not present

## 2020-08-06 DIAGNOSIS — Z23 Encounter for immunization: Secondary | ICD-10-CM | POA: Diagnosis not present

## 2020-10-05 DIAGNOSIS — R197 Diarrhea, unspecified: Secondary | ICD-10-CM | POA: Diagnosis not present

## 2020-10-22 DIAGNOSIS — Z01812 Encounter for preprocedural laboratory examination: Secondary | ICD-10-CM | POA: Diagnosis not present

## 2020-10-27 DIAGNOSIS — D125 Benign neoplasm of sigmoid colon: Secondary | ICD-10-CM | POA: Diagnosis not present

## 2020-10-27 DIAGNOSIS — D124 Benign neoplasm of descending colon: Secondary | ICD-10-CM | POA: Diagnosis not present

## 2020-10-27 DIAGNOSIS — D123 Benign neoplasm of transverse colon: Secondary | ICD-10-CM | POA: Diagnosis not present

## 2020-10-27 DIAGNOSIS — K635 Polyp of colon: Secondary | ICD-10-CM | POA: Diagnosis not present

## 2020-10-27 DIAGNOSIS — R197 Diarrhea, unspecified: Secondary | ICD-10-CM | POA: Diagnosis not present

## 2020-10-27 DIAGNOSIS — D122 Benign neoplasm of ascending colon: Secondary | ICD-10-CM | POA: Diagnosis not present

## 2021-04-08 DIAGNOSIS — I10 Essential (primary) hypertension: Secondary | ICD-10-CM | POA: Diagnosis not present

## 2021-04-08 DIAGNOSIS — Z23 Encounter for immunization: Secondary | ICD-10-CM | POA: Diagnosis not present

## 2021-04-08 DIAGNOSIS — F5101 Primary insomnia: Secondary | ICD-10-CM | POA: Diagnosis not present

## 2021-04-08 DIAGNOSIS — Z125 Encounter for screening for malignant neoplasm of prostate: Secondary | ICD-10-CM | POA: Diagnosis not present

## 2021-04-08 DIAGNOSIS — E782 Mixed hyperlipidemia: Secondary | ICD-10-CM | POA: Diagnosis not present

## 2021-05-10 DIAGNOSIS — I10 Essential (primary) hypertension: Secondary | ICD-10-CM | POA: Diagnosis not present

## 2021-05-10 DIAGNOSIS — G4733 Obstructive sleep apnea (adult) (pediatric): Secondary | ICD-10-CM | POA: Diagnosis not present

## 2021-05-25 ENCOUNTER — Encounter (HOSPITAL_COMMUNITY): Payer: Self-pay | Admitting: Emergency Medicine

## 2021-05-25 ENCOUNTER — Emergency Department (HOSPITAL_COMMUNITY): Payer: BC Managed Care – PPO

## 2021-05-25 ENCOUNTER — Emergency Department (HOSPITAL_COMMUNITY)
Admission: EM | Admit: 2021-05-25 | Discharge: 2021-05-25 | Disposition: A | Discharge status: Home / Self Care | Payer: BC Managed Care – PPO | Attending: Emergency Medicine | Admitting: Emergency Medicine

## 2021-05-25 ENCOUNTER — Other Ambulatory Visit: Payer: Self-pay

## 2021-05-25 DIAGNOSIS — Y990 Civilian activity done for income or pay: Secondary | ICD-10-CM | POA: Diagnosis not present

## 2021-05-25 DIAGNOSIS — S0001XA Abrasion of scalp, initial encounter: Secondary | ICD-10-CM | POA: Insufficient documentation

## 2021-05-25 DIAGNOSIS — S0990XA Unspecified injury of head, initial encounter: Secondary | ICD-10-CM | POA: Diagnosis not present

## 2021-05-25 DIAGNOSIS — Z20822 Contact with and (suspected) exposure to covid-19: Secondary | ICD-10-CM | POA: Diagnosis not present

## 2021-05-25 DIAGNOSIS — S6720XA Crushing injury of unspecified hand, initial encounter: Secondary | ICD-10-CM | POA: Diagnosis not present

## 2021-05-25 DIAGNOSIS — W230XXA Caught, crushed, jammed, or pinched between moving objects, initial encounter: Secondary | ICD-10-CM | POA: Diagnosis not present

## 2021-05-25 DIAGNOSIS — R0902 Hypoxemia: Secondary | ICD-10-CM | POA: Diagnosis not present

## 2021-05-25 DIAGNOSIS — S199XXA Unspecified injury of neck, initial encounter: Secondary | ICD-10-CM | POA: Diagnosis not present

## 2021-05-25 DIAGNOSIS — M7989 Other specified soft tissue disorders: Secondary | ICD-10-CM | POA: Diagnosis not present

## 2021-05-25 DIAGNOSIS — I10 Essential (primary) hypertension: Secondary | ICD-10-CM | POA: Diagnosis not present

## 2021-05-25 DIAGNOSIS — S59911A Unspecified injury of right forearm, initial encounter: Secondary | ICD-10-CM | POA: Diagnosis not present

## 2021-05-25 DIAGNOSIS — R609 Edema, unspecified: Secondary | ICD-10-CM | POA: Diagnosis not present

## 2021-05-25 DIAGNOSIS — R001 Bradycardia, unspecified: Secondary | ICD-10-CM | POA: Diagnosis not present

## 2021-05-25 DIAGNOSIS — T148XXA Other injury of unspecified body region, initial encounter: Secondary | ICD-10-CM

## 2021-05-25 DIAGNOSIS — S299XXA Unspecified injury of thorax, initial encounter: Secondary | ICD-10-CM | POA: Diagnosis not present

## 2021-05-25 DIAGNOSIS — M47812 Spondylosis without myelopathy or radiculopathy, cervical region: Secondary | ICD-10-CM | POA: Diagnosis not present

## 2021-05-25 DIAGNOSIS — T1490XA Injury, unspecified, initial encounter: Secondary | ICD-10-CM

## 2021-05-25 DIAGNOSIS — M2578 Osteophyte, vertebrae: Secondary | ICD-10-CM | POA: Diagnosis not present

## 2021-05-25 DIAGNOSIS — W19XXXA Unspecified fall, initial encounter: Secondary | ICD-10-CM | POA: Diagnosis not present

## 2021-05-25 HISTORY — DX: Essential (primary) hypertension: I10

## 2021-05-25 LAB — CBC
HCT: 52.1 % — ABNORMAL HIGH (ref 39.0–52.0)
Hemoglobin: 18.3 g/dL — ABNORMAL HIGH (ref 13.0–17.0)
MCH: 33.3 pg (ref 26.0–34.0)
MCHC: 35.1 g/dL (ref 30.0–36.0)
MCV: 94.9 fL (ref 80.0–100.0)
Platelets: 244 10*3/uL (ref 150–400)
RBC: 5.49 MIL/uL (ref 4.22–5.81)
RDW: 12.9 % (ref 11.5–15.5)
WBC: 11 10*3/uL — ABNORMAL HIGH (ref 4.0–10.5)
nRBC: 0 % (ref 0.0–0.2)

## 2021-05-25 LAB — COMPREHENSIVE METABOLIC PANEL
ALT: 36 U/L (ref 0–44)
AST: 44 U/L — ABNORMAL HIGH (ref 15–41)
Albumin: 3.8 g/dL (ref 3.5–5.0)
Alkaline Phosphatase: 60 U/L (ref 38–126)
Anion gap: 15 (ref 5–15)
BUN: 17 mg/dL (ref 6–20)
CO2: 20 mmol/L — ABNORMAL LOW (ref 22–32)
Calcium: 9.4 mg/dL (ref 8.9–10.3)
Chloride: 101 mmol/L (ref 98–111)
Creatinine, Ser: 1.27 mg/dL — ABNORMAL HIGH (ref 0.61–1.24)
GFR, Estimated: >60 mL/min (ref >60)
Glucose, Bld: 163 mg/dL — ABNORMAL HIGH (ref 70–99)
Potassium: 4.6 mmol/L (ref 3.5–5.1)
Sodium: 136 mmol/L (ref 135–145)
Total Bilirubin: 1.1 mg/dL (ref 0.3–1.2)
Total Protein: 6.7 g/dL (ref 6.5–8.1)

## 2021-05-25 LAB — SAMPLE TO BLOOD BANK

## 2021-05-25 LAB — RESP PANEL BY RT-PCR (FLU A&B, COVID) ARPGX2
Influenza A by PCR: NEGATIVE
Influenza B by PCR: NEGATIVE
SARS Coronavirus 2 by RT PCR: NEGATIVE

## 2021-05-25 LAB — I-STAT CHEM 8, ED
BUN: 22 mg/dL — ABNORMAL HIGH (ref 6–20)
Calcium, Ion: 1.08 mmol/L — ABNORMAL LOW (ref 1.15–1.40)
Chloride: 102 mmol/L (ref 98–111)
Creatinine, Ser: 1 mg/dL (ref 0.61–1.24)
Glucose, Bld: 159 mg/dL — ABNORMAL HIGH (ref 70–99)
HCT: 53 % — ABNORMAL HIGH (ref 39.0–52.0)
Hemoglobin: 18 g/dL — ABNORMAL HIGH (ref 13.0–17.0)
Potassium: 4.8 mmol/L (ref 3.5–5.1)
Sodium: 136 mmol/L (ref 135–145)
TCO2: 22 mmol/L (ref 22–32)

## 2021-05-25 LAB — PROTIME-INR
INR: 1 (ref 0.8–1.2)
Prothrombin Time: 13.5 seconds (ref 11.4–15.2)

## 2021-05-25 LAB — ETHANOL: Alcohol, Ethyl (B): <10 mg/dL (ref <10)

## 2021-05-25 LAB — LACTIC ACID, PLASMA: Lactic Acid, Venous: 8.3 mmol/L (ref 0.5–1.9)

## 2021-05-25 NOTE — ED Triage Notes (Signed)
Pt arrived as level 2 trauma via [facility] from home. Pt was in his driveway working under his car. Pt accidentally hit the jack and it fell, pt's head and R forearm were pinned underneath car for approx 15 min. When neighbors got to him he was c/o shortness of breath. VSS w/ Ems, GCS 15. Pt has hematoma to L side head and puncture wound to R forearm, pt c/o head and R forearm pain, movement and sensory intact. 16g LAC, 18g R hand.

## 2021-05-25 NOTE — Progress Notes (Signed)
Orthopedic Tech Progress Note Patient Details:  Robert Hanson [birth year] [id number] Level 2 Trauma. Not needed upon arrival  Patient ID: Robert Hanson, male   DOB: Dec 26, 1965, 55 y.o.   MRN: [id number]  Robert Hanson 05/25/2021, 2:39 PM

## 2021-05-25 NOTE — ED Provider Notes (Addendum)
[facility] EMERGENCY DEPARTMENT Provider Note   CSN: [id number] Arrival date & time: 05/25/21  1333     History Chief Complaint  Patient presents with   Pinned under car    Robert Hanson is a 55 y.o. male.  Pattern or her weaknessesPatient presented as a trauma activation.  He states he was working on his car which was being held up by a jack was pinned by his head underneath the car.  He states that he feels like the transmission or some other part the car also fell onto his right elbow complaining of pain there.  Complaining of left-sided headache described as mild.  Denies chest pain denies abdominal pain denies other extremity pain.  Denies loss of consciousness.  No fever no cough no vomiting or diarrhea.      Past Medical History:  Diagnosis Date   Hypertension     There are no problems to display for this patient.   History reviewed. No pertinent surgical history.     History reviewed. No pertinent family history.  Social History   Tobacco Use   Smoking status: Never   Smokeless tobacco: Never  Substance Use Topics   Alcohol use: Not Currently   Drug use: Never    Home Medications Prior to Admission medications   Not on File    Allergies    Patient has no known allergies.  Review of Systems   Review of Systems  Constitutional:  Negative for fever.  HENT:  Negative for ear pain and sore throat.   Eyes:  Negative for pain.  Respiratory:  Negative for cough.   Cardiovascular:  Negative for chest pain.  Gastrointestinal:  Negative for abdominal pain.  Genitourinary:  Negative for flank pain.  Musculoskeletal:  Negative for back pain.  Skin:  Negative for color change and rash.  Neurological:  Positive for headaches. Negative for syncope.  All other systems reviewed and are negative.  Physical Exam Updated Vital Signs BP (!) 141/98   Pulse 68   Resp 15   Ht 6\' 1"  (1.854 m)   Wt 90.7 kg   SpO2 95%   BMI 26.39 kg/m    Physical Exam Constitutional:      Appearance: He is well-developed.  HENT:     Head: Normocephalic.     Comments: Swelling abrasion seen in the left temporal region of his skull.  No open laceration noted.  No facial swelling tenderness or trauma noted.    Nose: Nose normal.  Eyes:     Extraocular Movements: Extraocular movements intact.  Cardiovascular:     Rate and Rhythm: Normal rate.  Pulmonary:     Effort: Pulmonary effort is normal.  Musculoskeletal:     Comments: Normal range of motion of bilateral shoulders elbows wrists hips knees and ankles.  No C or T or L-spine midline step-offs or tenderness noted.  Abrasion seen in the right elbow, otherwise bilateral upper extremities are neurovascular intact distal pulses are 2+ and intact grip strength 5/5 strength and equal bilaterally.  Patient ranging elbows and wrist without pain or discomfort bilaterally.  Skin:    Coloration: Skin is not jaundiced.  Neurological:     General: No focal deficit present.     Mental Status: He is alert and oriented to person, place, and time. Mental status is at baseline.     Cranial Nerves: No cranial nerve deficit.     Motor: No weakness.    ED Results / Procedures /  Treatments   Labs (all labs ordered are listed, but only abnormal results are displayed) Labs Reviewed  COMPREHENSIVE METABOLIC PANEL - Abnormal; Notable for the following components:      Result Value   CO2 20 (*)    Glucose, Bld 163 (*)    Creatinine, Ser 1.27 (*)    AST 44 (*)    All other components within normal limits  CBC - Abnormal; Notable for the following components:   WBC 11.0 (*)    Hemoglobin 18.3 (*)    HCT 52.1 (*)    All other components within normal limits  I-STAT CHEM 8, ED - Abnormal; Notable for the following components:   BUN 22 (*)    Glucose, Bld 159 (*)    Calcium, Ion 1.08 (*)    Hemoglobin 18.0 (*)    HCT 53.0 (*)    All other components within normal limits  RESP PANEL BY RT-PCR (FLU  A&B, COVID) ARPGX2  ETHANOL  PROTIME-INR  URINALYSIS, ROUTINE W REFLEX MICROSCOPIC  LACTIC ACID, PLASMA  SAMPLE TO BLOOD BANK    EKG None  Radiology DG Forearm Right  Result Date: 05/25/2021 CLINICAL DATA:  Level 2 trauma, car fell off jack pinning patient underneath for 15 minutes EXAM: RIGHT FOREARM - 2 VIEW COMPARISON:  None FINDINGS: Osseous mineralization normal. Joint spaces preserved. No acute fracture, dislocation, or bone destruction. Soft tissue gas and soft tissue swelling dorsally at the proximal forearm. IMPRESSION: No acute osseous abnormalities. Electronically Signed   By: Lavonia Dana M.D.   On: 05/25/2021 14:25   CT HEAD WO CONTRAST  Result Date: 05/25/2021 CLINICAL DATA:  Level 2 trauma, car fell off jack landed on patient EXAM: CT HEAD WITHOUT CONTRAST TECHNIQUE: Contiguous axial images were obtained from the base of the skull through the vertex without intravenous contrast. Sagittal and coronal MPR images reconstructed from axial data set. COMPARISON:  None FINDINGS: Brain: Normal ventricular morphology. No midline shift or mass effect. Normal appearance of brain parenchyma. No intracranial hemorrhage, mass lesion, evidence of acute infarction, or extra-axial fluid collection. Vascular: No hyperdense vessels Skull: Intact Sinuses/Orbits: Clear Other: N/A IMPRESSION: Normal exam. Electronically Signed   By: Lavonia Dana M.D.   On: 05/25/2021 14:29   CT CERVICAL SPINE WO CONTRAST  Result Date: 05/25/2021 CLINICAL DATA:  Level 2 trauma, car fell off car jack pinning patient, trapped for 15 minutes EXAM: CT CERVICAL SPINE WITHOUT CONTRAST TECHNIQUE: Multidetector CT imaging of the cervical spine was performed without intravenous contrast. Multiplanar CT image reconstructions were also generated. COMPARISON:  None FINDINGS: Alignment: Normal Skull base and vertebrae: Osseous mineralization normal. Skull base intact. Slight disc space narrowing and endplate spur formation at  C5-C6 with endplate sclerosis. Additional disc space narrowing and tiny endplate spurs at T0-V7. Vertebral body heights maintained without fracture or subluxation. No bone destruction. Soft tissues and spinal canal: Prevertebral soft tissues normal thickness. Regional soft tissues unremarkable. Disc levels:  No significant abnormalities Upper chest: Tips of lung apices clear Other: N/A IMPRESSION: Mild degenerative disc disease changes at C5-C6 and C6-C7. No acute cervical spine abnormalities. Electronically Signed   By: Lavonia Dana M.D.   On: 05/25/2021 14:33   DG Chest Port 1 View  Result Date: 05/25/2021 CLINICAL DATA:  Level 2 trauma, car fell off jack landing on patient, pinned for 15 minutes EXAM: PORTABLE CHEST 1 VIEW COMPARISON:  Portable exam 1346 hours without priors for comparison FINDINGS: Normal heart size, mediastinal contours, and pulmonary vascularity. Lungs  clear. No pulmonary infiltrate, pleural effusion, or pneumothorax. Osseous structures unremarkable. IMPRESSION: No acute abnormalities. Electronically Signed   By: Lavonia Dana M.D.   On: 05/25/2021 14:30    Procedures Procedures   Medications Ordered in ED Medications - No data to display  ED Course  I have reviewed the triage vital signs and the nursing notes.  Pertinent labs & imaging results that were available during my care of the patient were reviewed by me and considered in my medical decision making (see chart for details).    MDM Rules/Calculators/A&P                           Labs and imaging ordered and unremarkable for acute findings.  No fracture dislocation or internal bleeding noted.  Patient continues declined pain medications.  Discharged home in stable condition advised return if he has worsening symptoms fevers difficulty breathing or any additional concerns.  Addendum: patient was set for discharge, 1 lab called and stated that lactic acid was elevated.  Etiology is unclear.  However he has no pain  or discomfort abdominal exam was done serially with no tenderness or guarding or rebound last exam done at 3:15 PM with no pain or tenderness.  Elevated value appears nonspecific.  Given lack of additional symptoms, patient discharged home in stable condition.  Advise follow-up with his doctor in 2 or 3 days.  Advised immediate return for worsening symptoms or any additional concerns.   Final Clinical Impression(s) / ED Diagnoses Final diagnoses:  Trauma  Crush injury    Rx / DC Orders ED Discharge Orders     None        Luna Fuse, MD 05/25/21 1506    Luna Fuse, MD 05/25/21 [phone]

## 2021-05-25 NOTE — ED Notes (Signed)
Informed Dr. Almyra Free lab just called re: lactic 8.3. MD acknowledged and okay to dc patient now.

## 2021-05-25 NOTE — ED Notes (Signed)
Patient transported to CT w/ TRN Valetta Fuller

## 2021-05-25 NOTE — Discharge Instructions (Signed)
Call your primary care doctor or specialist as discussed in the next 2-3 days.   Return immediately back to the ER if:  Your symptoms worsen within the next 12-24 hours. You develop new symptoms such as new fevers, persistent vomiting, new pain, shortness of breath, or new weakness or numbness, or if you have any other concerns.  

## 2021-06-17 DIAGNOSIS — G4733 Obstructive sleep apnea (adult) (pediatric): Secondary | ICD-10-CM | POA: Diagnosis not present

## 2021-07-17 DIAGNOSIS — G4733 Obstructive sleep apnea (adult) (pediatric): Secondary | ICD-10-CM | POA: Diagnosis not present

## 2021-08-08 DIAGNOSIS — G4733 Obstructive sleep apnea (adult) (pediatric): Secondary | ICD-10-CM | POA: Diagnosis not present

## 2021-08-17 DIAGNOSIS — G4733 Obstructive sleep apnea (adult) (pediatric): Secondary | ICD-10-CM | POA: Diagnosis not present

## 2021-08-22 DIAGNOSIS — Z23 Encounter for immunization: Secondary | ICD-10-CM | POA: Diagnosis not present

## 2021-09-17 DIAGNOSIS — G4733 Obstructive sleep apnea (adult) (pediatric): Secondary | ICD-10-CM | POA: Diagnosis not present

## 2021-10-19 DIAGNOSIS — E782 Mixed hyperlipidemia: Secondary | ICD-10-CM | POA: Diagnosis not present

## 2021-10-19 DIAGNOSIS — I10 Essential (primary) hypertension: Secondary | ICD-10-CM | POA: Diagnosis not present

## 2021-10-19 DIAGNOSIS — Z0000 Encounter for general adult medical examination without abnormal findings: Secondary | ICD-10-CM | POA: Diagnosis not present

## 2021-10-19 DIAGNOSIS — R7309 Other abnormal glucose: Secondary | ICD-10-CM | POA: Diagnosis not present

## 2021-10-19 DIAGNOSIS — M25521 Pain in right elbow: Secondary | ICD-10-CM | POA: Diagnosis not present

## 2021-10-19 DIAGNOSIS — F411 Generalized anxiety disorder: Secondary | ICD-10-CM | POA: Diagnosis not present

## 2021-11-30 DIAGNOSIS — I10 Essential (primary) hypertension: Secondary | ICD-10-CM | POA: Diagnosis not present

## 2021-11-30 DIAGNOSIS — Z87891 Personal history of nicotine dependence: Secondary | ICD-10-CM | POA: Diagnosis not present

## 2021-11-30 DIAGNOSIS — E782 Mixed hyperlipidemia: Secondary | ICD-10-CM | POA: Diagnosis not present

## 2021-11-30 DIAGNOSIS — Z122 Encounter for screening for malignant neoplasm of respiratory organs: Secondary | ICD-10-CM | POA: Diagnosis not present

## 2021-12-02 ENCOUNTER — Encounter: Payer: Self-pay | Admitting: Neurology

## 2021-12-06 ENCOUNTER — Other Ambulatory Visit (HOSPITAL_COMMUNITY): Payer: Self-pay | Admitting: Family Medicine

## 2021-12-06 DIAGNOSIS — G4733 Obstructive sleep apnea (adult) (pediatric): Secondary | ICD-10-CM | POA: Diagnosis not present

## 2021-12-06 DIAGNOSIS — I10 Essential (primary) hypertension: Secondary | ICD-10-CM

## 2021-12-06 DIAGNOSIS — E782 Mixed hyperlipidemia: Secondary | ICD-10-CM

## 2021-12-07 DIAGNOSIS — M79641 Pain in right hand: Secondary | ICD-10-CM | POA: Diagnosis not present

## 2021-12-07 DIAGNOSIS — S63274A Dislocation of unspecified interphalangeal joint of right ring finger, initial encounter: Secondary | ICD-10-CM | POA: Diagnosis not present

## 2021-12-12 ENCOUNTER — Encounter (HOSPITAL_BASED_OUTPATIENT_CLINIC_OR_DEPARTMENT_OTHER): Payer: Self-pay

## 2021-12-12 ENCOUNTER — Ambulatory Visit (HOSPITAL_BASED_OUTPATIENT_CLINIC_OR_DEPARTMENT_OTHER): Payer: BC Managed Care – PPO | Attending: Family Medicine

## 2022-01-20 ENCOUNTER — Encounter: Payer: Self-pay | Admitting: Neurology

## 2022-01-20 ENCOUNTER — Ambulatory Visit (INDEPENDENT_AMBULATORY_CARE_PROVIDER_SITE_OTHER): Payer: BC Managed Care – PPO | Admitting: Neurology

## 2022-01-20 VITALS — BP 146/90 | HR 86 | Ht 73.0 in | Wt 198.0 lb

## 2022-01-20 DIAGNOSIS — G5712 Meralgia paresthetica, left lower limb: Secondary | ICD-10-CM

## 2022-05-08 ENCOUNTER — Ambulatory Visit: Payer: BC Managed Care – PPO | Admitting: Neurology

## 2023-03-13 ENCOUNTER — Other Ambulatory Visit: Payer: Self-pay

## 2023-03-13 ENCOUNTER — Emergency Department (HOSPITAL_BASED_OUTPATIENT_CLINIC_OR_DEPARTMENT_OTHER): Payer: BC Managed Care – PPO

## 2023-03-13 ENCOUNTER — Emergency Department (HOSPITAL_BASED_OUTPATIENT_CLINIC_OR_DEPARTMENT_OTHER)
Admission: EM | Admit: 2023-03-13 | Discharge: 2023-03-13 | Disposition: A | Discharge status: Home / Self Care | Payer: BC Managed Care – PPO | Attending: Emergency Medicine | Admitting: Emergency Medicine

## 2023-03-13 DIAGNOSIS — I10 Essential (primary) hypertension: Secondary | ICD-10-CM | POA: Diagnosis not present

## 2023-03-13 DIAGNOSIS — K921 Melena: Secondary | ICD-10-CM | POA: Diagnosis present

## 2023-03-13 DIAGNOSIS — K922 Gastrointestinal hemorrhage, unspecified: Secondary | ICD-10-CM | POA: Insufficient documentation

## 2023-03-13 DIAGNOSIS — Z79899 Other long term (current) drug therapy: Secondary | ICD-10-CM | POA: Insufficient documentation

## 2023-03-13 LAB — CBC
HCT: 52 % (ref 39.0–52.0)
Hemoglobin: 18.1 g/dL — ABNORMAL HIGH (ref 13.0–17.0)
MCH: 33.6 pg (ref 26.0–34.0)
MCHC: 34.8 g/dL (ref 30.0–36.0)
MCV: 96.7 fL (ref 80.0–100.0)
Platelets: 180 10*3/uL (ref 150–400)
RBC: 5.38 MIL/uL (ref 4.22–5.81)
RDW: 12.9 % (ref 11.5–15.5)
WBC: 7.6 10*3/uL (ref 4.0–10.5)
nRBC: 0 % (ref 0.0–0.2)

## 2023-03-13 LAB — COMPREHENSIVE METABOLIC PANEL
ALT: 35 U/L (ref 0–44)
AST: 24 U/L (ref 15–41)
Albumin: 4.7 g/dL (ref 3.5–5.0)
Alkaline Phosphatase: 66 U/L (ref 38–126)
Anion gap: 8 (ref 5–15)
BUN: 20 mg/dL (ref 6–20)
CO2: 27 mmol/L (ref 22–32)
Calcium: 9.8 mg/dL (ref 8.9–10.3)
Chloride: 103 mmol/L (ref 98–111)
Creatinine, Ser: 0.96 mg/dL (ref 0.61–1.24)
GFR, Estimated: >60 mL/min (ref >60)
Glucose, Bld: 93 mg/dL (ref 70–99)
Potassium: 3.6 mmol/L (ref 3.5–5.1)
Sodium: 138 mmol/L (ref 135–145)
Total Bilirubin: 0.9 mg/dL (ref 0.3–1.2)
Total Protein: 7.6 g/dL (ref 6.5–8.1)

## 2023-03-13 LAB — OCCULT BLOOD X 1 CARD TO LAB, STOOL: Fecal Occult Bld: POSITIVE — AB

## 2023-03-13 NOTE — ED Triage Notes (Signed)
Pt presents to the with bloody stool, back pain, weakness dizziness and cough.  The bloody stool had an onset today. Pt reports bright red blood. Pt denies abdominal pain.

## 2023-03-13 NOTE — ED Notes (Signed)
Pt has iodine allergy. Plunkett MD made aware, CT changed to non contrast

## 2023-03-13 NOTE — ED Notes (Signed)
 RN reviewed discharge instructions with pt. Pt verbalized understanding and had no further questions. VSS upon discharge.  

## 2023-03-13 NOTE — Discharge Instructions (Addendum)
The blood counts today look fine and your CAT scan did not show any acute findings.  This may be internal hemorrhoids versus something bleeding higher up.  You most likely will have blood in your stool with your next bowel movement.  However if you start having very frequent bloody stools, feeling lightheaded, dizzy, chest pain, shortness of breath or passing out return to the emergency room

## 2023-03-13 NOTE — ED Notes (Signed)
This RN came to speak to pt in the lobby, as it was reported that pt was upset about the wait time and felt his care was not being taken seriously. Pt visibly upset in the lobby, stating to this RN, "I guess I'm not emergency enough to be here, because everybody else keeps going back before me!". This RN explained to the patient that his "care is being taken seriously, and he is currently the longest wait and we are prioritizing that, but at the moment, we do not have a bed available, but will soon have a few discharges and he would be roomed promptly". Also explained to pt that per daytime staff, pt had stepped outside, and perhaps pt's name was called during this time, hence why his rooming was delayed. Pt calmer and collected at this time, and I ensured him he would get a room as soon as possible, and that I would speak to the EDP to see if imaging in the meantime was appropriate. Pt agreeable to this.

## 2023-03-13 NOTE — ED Notes (Signed)
Report given to the next RN... 

## 2023-03-13 NOTE — ED Provider Notes (Signed)
[place] EMERGENCY DEPARTMENT AT [facility] Provider Note   CSN: [id number] Arrival date & time: 03/13/23  1644     History  No chief complaint on file.   Robert Hanson is a 57 y.o. male.  Patient is a 57 year old male with a history of hypertension who is presenting today with complaints of bloody stools that started around noon today.  Patient reports that he went to the bathroom and when he got up there was significant blood in the toilet.  He did not notice any clots and had no rectal pain or abdominal pain.  He called his GI provider who recommended that he come to the emergency room because he would need to have a CAT scan before they were able to see him and do a colonoscopy.  Patient reports on his last colonoscopy 2 years ago he did have multiple polyps and they were planning on following up with him soon for a recheck.  He denies ever having blood in his stool before.  He takes no anticoagulation.  He reports he had a second bowel movement while he was waiting in the emergency room that also showed a significant amount of blood but again denies any pain.  He did report that the it was leaking a little bit of blood in his underwear between bowel movements.  He has not had significant constipation and again denies any rectal pain.  He denies for me weakness, shortness of breath, chest pain or feeling like he is going to pass out.  The history is provided by the patient.       Home Medications Prior to Admission medications   Medication Sig Start Date End Date Taking? Authorizing Provider  losartan-hydrochlorothiazide (HYZAAR) 50-12.5 MG tablet Take 1 tablet by mouth daily.    [provider]  sertraline (ZOLOFT) 100 MG tablet TAKE 1 AND 1/2 TABLETS BY MOUTH ONCE A DAY. 02/16/19   [provider]  zolpidem (AMBIEN) 5 MG tablet 5 mg at bedtime.  12/17/17   [provider]      Allergies    Levaquin [levofloxacin] and Iodine    Review of  Systems   Review of Systems  Physical Exam Updated Vital Signs BP 116/77   Pulse 63   Temp 98.2 F (36.8 C) (Oral)   Resp 18   SpO2 100%  Physical Exam Vitals and nursing note reviewed.  Constitutional:      General: He is not in acute distress.    Appearance: He is well-developed.  HENT:     Head: Normocephalic and atraumatic.  Eyes:     Conjunctiva/sclera: Conjunctivae normal.     Pupils: Pupils are equal, round, and reactive to light.  Cardiovascular:     Rate and Rhythm: Normal rate and regular rhythm.     Heart sounds: No murmur heard. Pulmonary:     Effort: Pulmonary effort is normal. No respiratory distress.     Breath sounds: Normal breath sounds. No wheezing or rales.  Abdominal:     General: There is no distension.     Palpations: Abdomen is soft.     Tenderness: There is no abdominal tenderness. There is no guarding or rebound.  Genitourinary:    Rectum: Normal. Guaiac result positive.     Comments: No evidence of external hemorrhoids.  No tenderness on rectal exam exam or masses palpated.  Slightly tinged pink finger on digital rectal exam but no gross blood present. Musculoskeletal:  General: No tenderness. Normal range of motion.     Cervical back: Normal range of motion and neck supple.  Skin:    General: Skin is warm and dry.     Findings: No erythema or rash.  Neurological:     Mental Status: He is alert and oriented to person, place, and time.  Psychiatric:        Behavior: Behavior normal.     ED Results / Procedures / Treatments   Labs (all labs ordered are listed, but only abnormal results are displayed) Labs Reviewed  CBC - Abnormal; Notable for the following components:      Result Value   Hemoglobin 18.1 (*)    All other components within normal limits  OCCULT BLOOD X 1 CARD TO LAB, STOOL - Abnormal; Notable for the following components:   Fecal Occult Bld POSITIVE (*)    All other components within normal limits  COMPREHENSIVE  METABOLIC PANEL    EKG None  Radiology CT ABDOMEN PELVIS WO CONTRAST  Result Date: 03/13/2023 CLINICAL DATA:  GI hemorrhage EXAM: CT ABDOMEN AND PELVIS WITHOUT CONTRAST TECHNIQUE: Multidetector CT imaging of the abdomen and pelvis was performed following the standard protocol without IV contrast. RADIATION DOSE REDUCTION: This exam was performed according to the departmental dose-optimization program which includes automated exposure control, adjustment of the mA and/or kV according to patient size and/or use of iterative reconstruction technique. COMPARISON:  None Available. FINDINGS: Lower chest: No acute abnormality. Hepatobiliary: No focal liver abnormality is seen. No gallstones, gallbladder wall thickening, or biliary dilatation. Pancreas: Unremarkable. No pancreatic ductal dilatation or surrounding inflammatory changes. Spleen: Normal in size without focal abnormality. Adrenals/Urinary Tract: Adrenal glands are well visualized bilaterally. The right adrenal gland is within normal limits. Left adrenal gland demonstrates a 2.8 cm lesion measuring -2.6 Hounsfield units consistent with an adrenal adenoma. No further follow-up is recommended. Kidneys show no renal calculi or obstructive changes. The ureters are within normal limits. The bladder is decompressed. Stomach/Bowel: No obstructive or inflammatory changes of the colon are noted. The appendix is within normal limits. Small bowel and stomach are unremarkable. Vascular/Lymphatic: Aortic atherosclerosis. No enlarged abdominal or pelvic lymph nodes. Reproductive: Prostate is unremarkable. Other: No abdominal wall hernia or abnormality. No abdominopelvic ascites. Musculoskeletal: No acute or significant osseous findings. IMPRESSION: No acute abnormality to correspond with the given clinical history. Left adrenal mass measuring 2.8 cm, consistent with lipid-rich benign adenoma. No follow-up imaging is recommended. JACR 2017 Aug; 14(8):1038-44, JCAT 2016  Mar-Apr; 40(2):194-200, Urol J 2006 Spring; 3(2):71-4. Electronically Signed   By: Alcide Clever M.D.   On: 03/13/2023 22:05    Procedures Procedures    Medications Ordered in ED Medications - No data to display  ED Course/ Medical Decision Making/ A&P                                 Medical Decision Making Amount and/or Complexity of Data Reviewed Labs: ordered. Decision-making details documented in ED Course. Radiology: ordered and independent interpretation performed. Decision-making details documented in ED Course.   Pt with multiple medical problems and comorbidities and presenting today with a complaint that caries a high risk for morbidity and mortality.  Here today with lower GI bleeding.  Patient is otherwise well-appearing with normal vital signs no abdominal pain.  I independently interpreted patient's labs and hemoglobin is 18 and CMP within normal limits.  Rectal exam shows no external  hemorrhoids but concern for possible internal hemorrhoids versus bleeding from diverticulosis or some other lower GI source.  Patient has no masses in his rectum.  I have independently visualized and interpreted pt's images today.  CT of the abdomen pelvis without acute pathology however patient is allergic to IV contrast and could not receive that today but do not feel that he needed a CTA as he has had 2 bloody stools in the last 12 hours and is hemodynamically stable with normal hemoglobin.  Patient is also asymptomatic.  At this time feel that it is reasonable for patient to follow-up with gastroenterology as he takes no anticoagulation.  He was given return precautions.  He is comfortable with this plan.  No indication for admission at this time.          Final Clinical Impression(s) / ED Diagnoses Final diagnoses:  Acute lower GI bleeding    Rx / DC Orders ED Discharge Orders     None         Gwyneth Sprout, MD 03/13/23 2350

## 2023-06-06 IMAGING — CT CT CERVICAL SPINE W/O CM
3 of 4 series · 13 of 33 positions shown, 16 images · non-contrast
Comparison: None

CLINICAL DATA: Level 2 trauma, car fell off car jack pinning
patient, trapped for 15 minutes

EXAM:
CT CERVICAL SPINE WITHOUT CONTRAST
TECHNIQUE: Multidetector CT imaging of the cervical spine was performed without
intravenous contrast. Multiplanar CT image reconstructions were also
generated.

[Series 4: c_spine 2.0 st · axial · 0.39mm/px · z∈[-226,-116]mm · 5 of 83 slices shown, 7 images]
[im 14/83  soft-tissue]
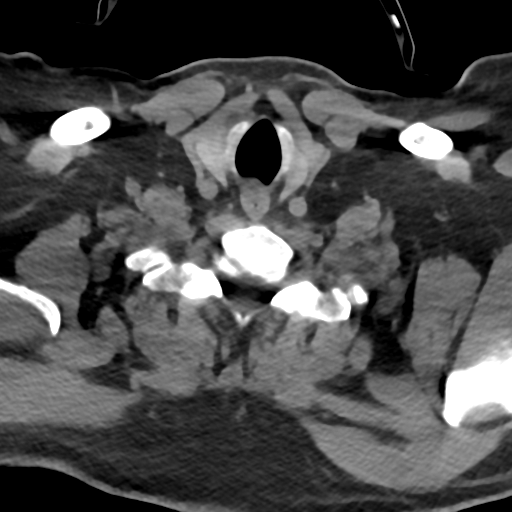
[im 14/83  bone]
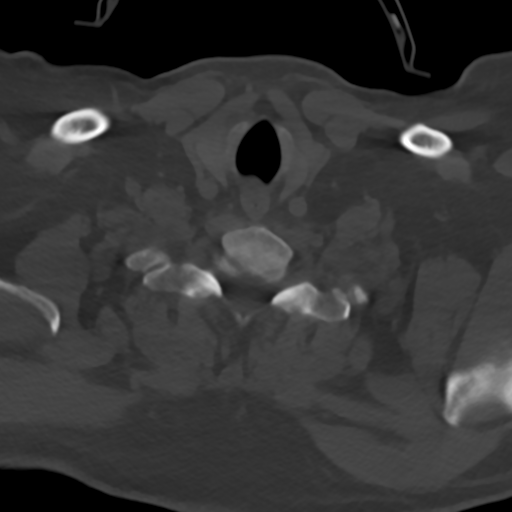
[im 28/83  bone]
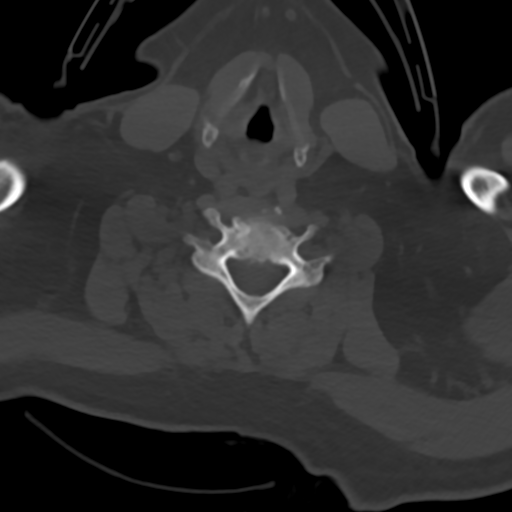
[im 42/83  bone]
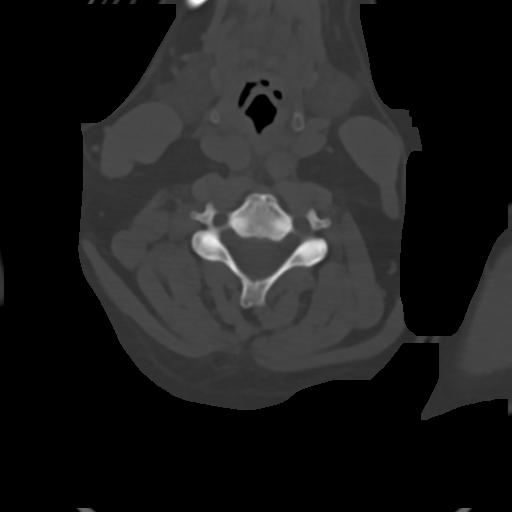
[im 55/83  bone]
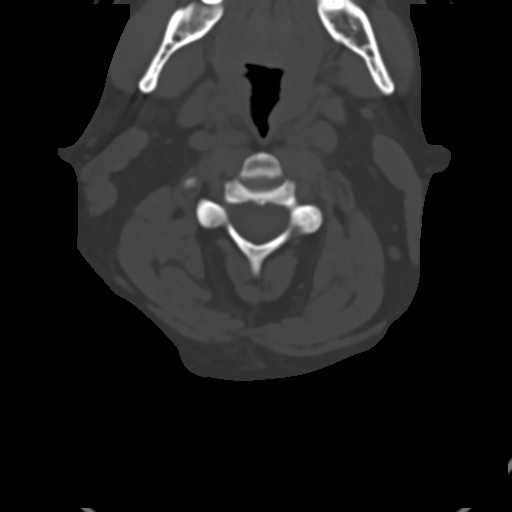
[im 69/83  soft-tissue]
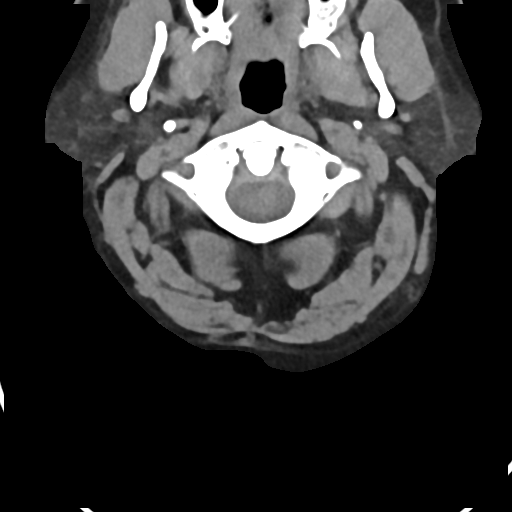
[im 69/83  bone]
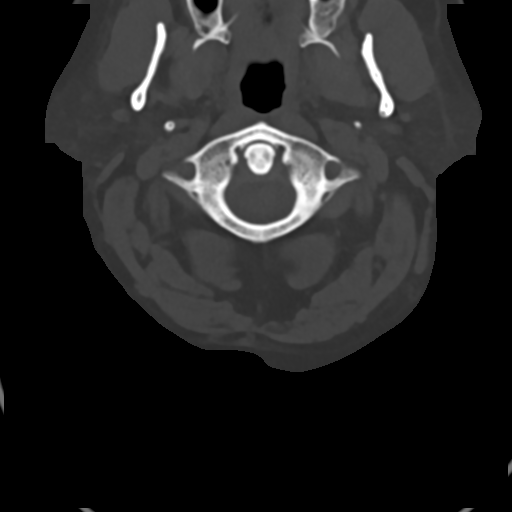

[Series 8: c_spine 2.0 sag bone · sagittal · 0.28mm/px · 5 of 61 slices shown, 6 images]
[im 21/61  bone]
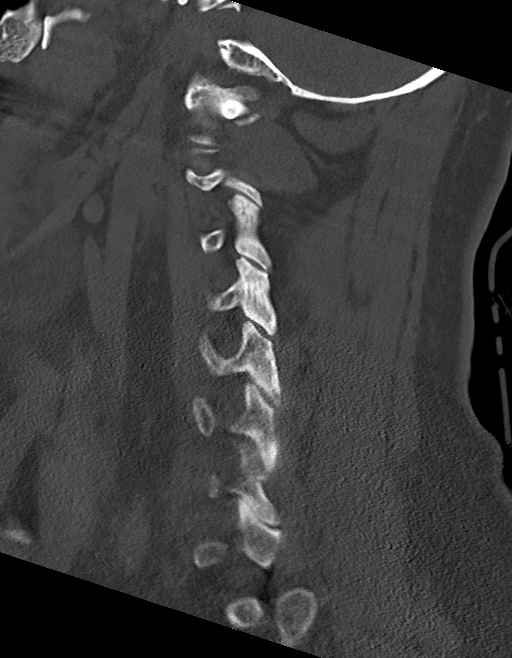
[im 26/61  bone]
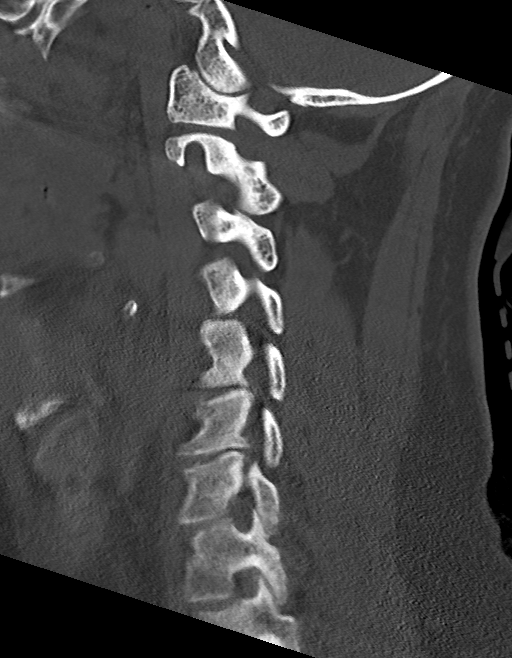
[im 31/61  soft-tissue]
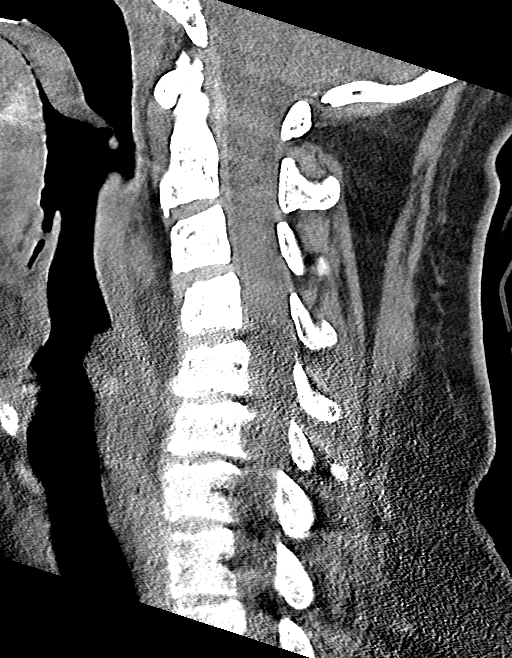
[im 31/61  bone]
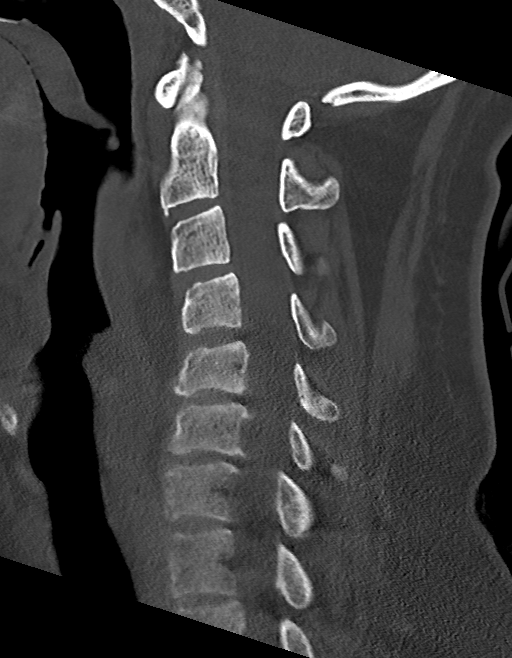
[im 36/61  bone]
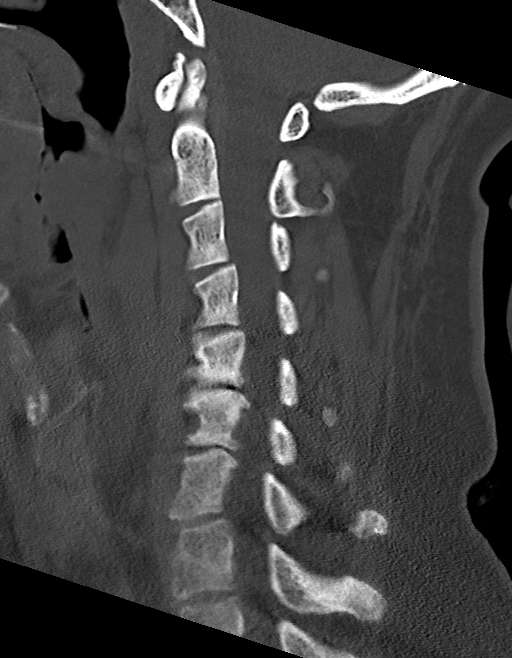
[im 41/61  bone]
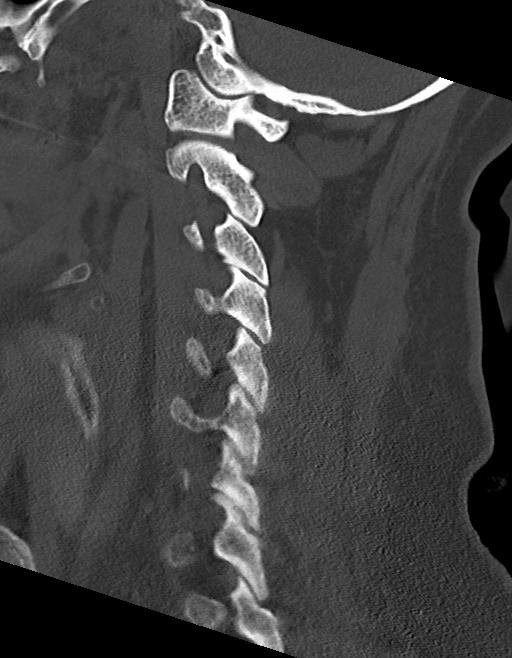

[Series 9: c_spine 2.0 cor bone · coronal · 0.24mm/px · 3 of 61 slices shown]
[im 13/61  bone]
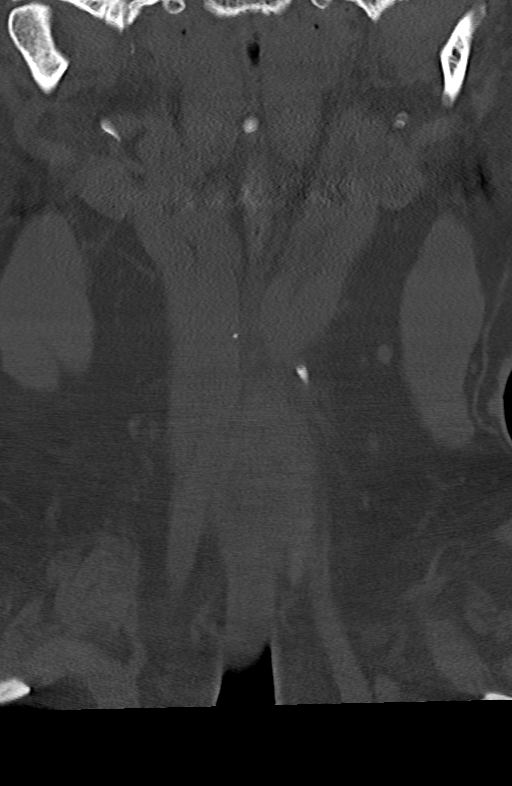
[im 25/61  bone]
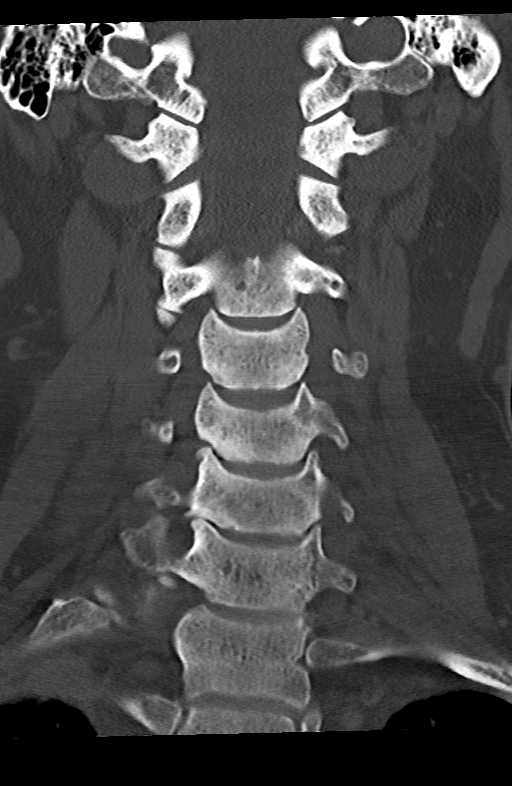
[im 37/61  bone]
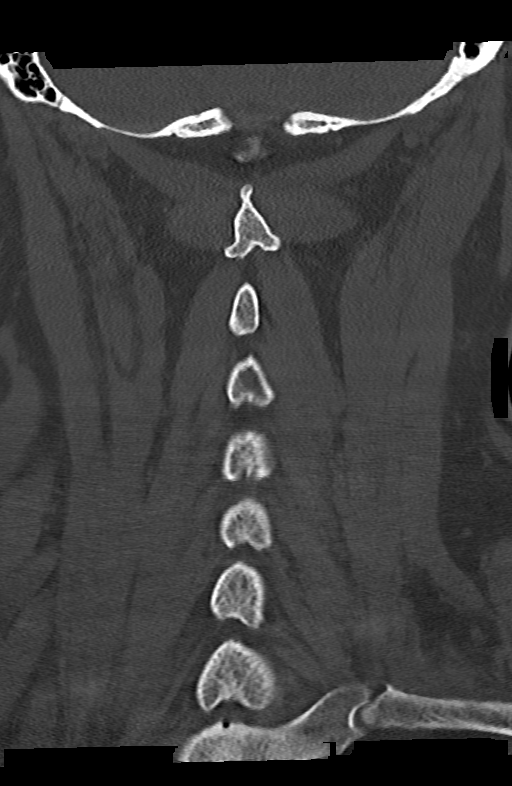

[13 of 33 positions shown; findings below may reference images not displayed]

FINDINGS: Alignment: Normal

Skull base and vertebrae: Osseous mineralization normal. Skull base
intact. Slight disc space narrowing and endplate spur formation at
C5-C6 with endplate sclerosis. Additional disc space narrowing and
tiny endplate spurs at C6-C7. Vertebral body heights maintained
without fracture or subluxation. No bone destruction.

Soft tissues and spinal canal: Prevertebral soft tissues normal
thickness. Regional soft tissues unremarkable.

Disc levels:  No significant abnormalities

Upper chest: Tips of lung apices clear

Other: N/A
IMPRESSION: Mild degenerative disc disease changes at C5-C6 and C6-C7.

No acute cervical spine abnormalities.

## 2023-06-06 IMAGING — DX DG CHEST 1V PORT
2 series · 2 of 2 positions shown · non-contrast
Comparison: Portable exam 9436 hours without priors for comparison

CLINICAL DATA: Level 2 trauma, car fell off [employer] on
patient, pinned for 15 minutes

EXAM:
PORTABLE CHEST 1 VIEW

[chest ap (1 of 2)]
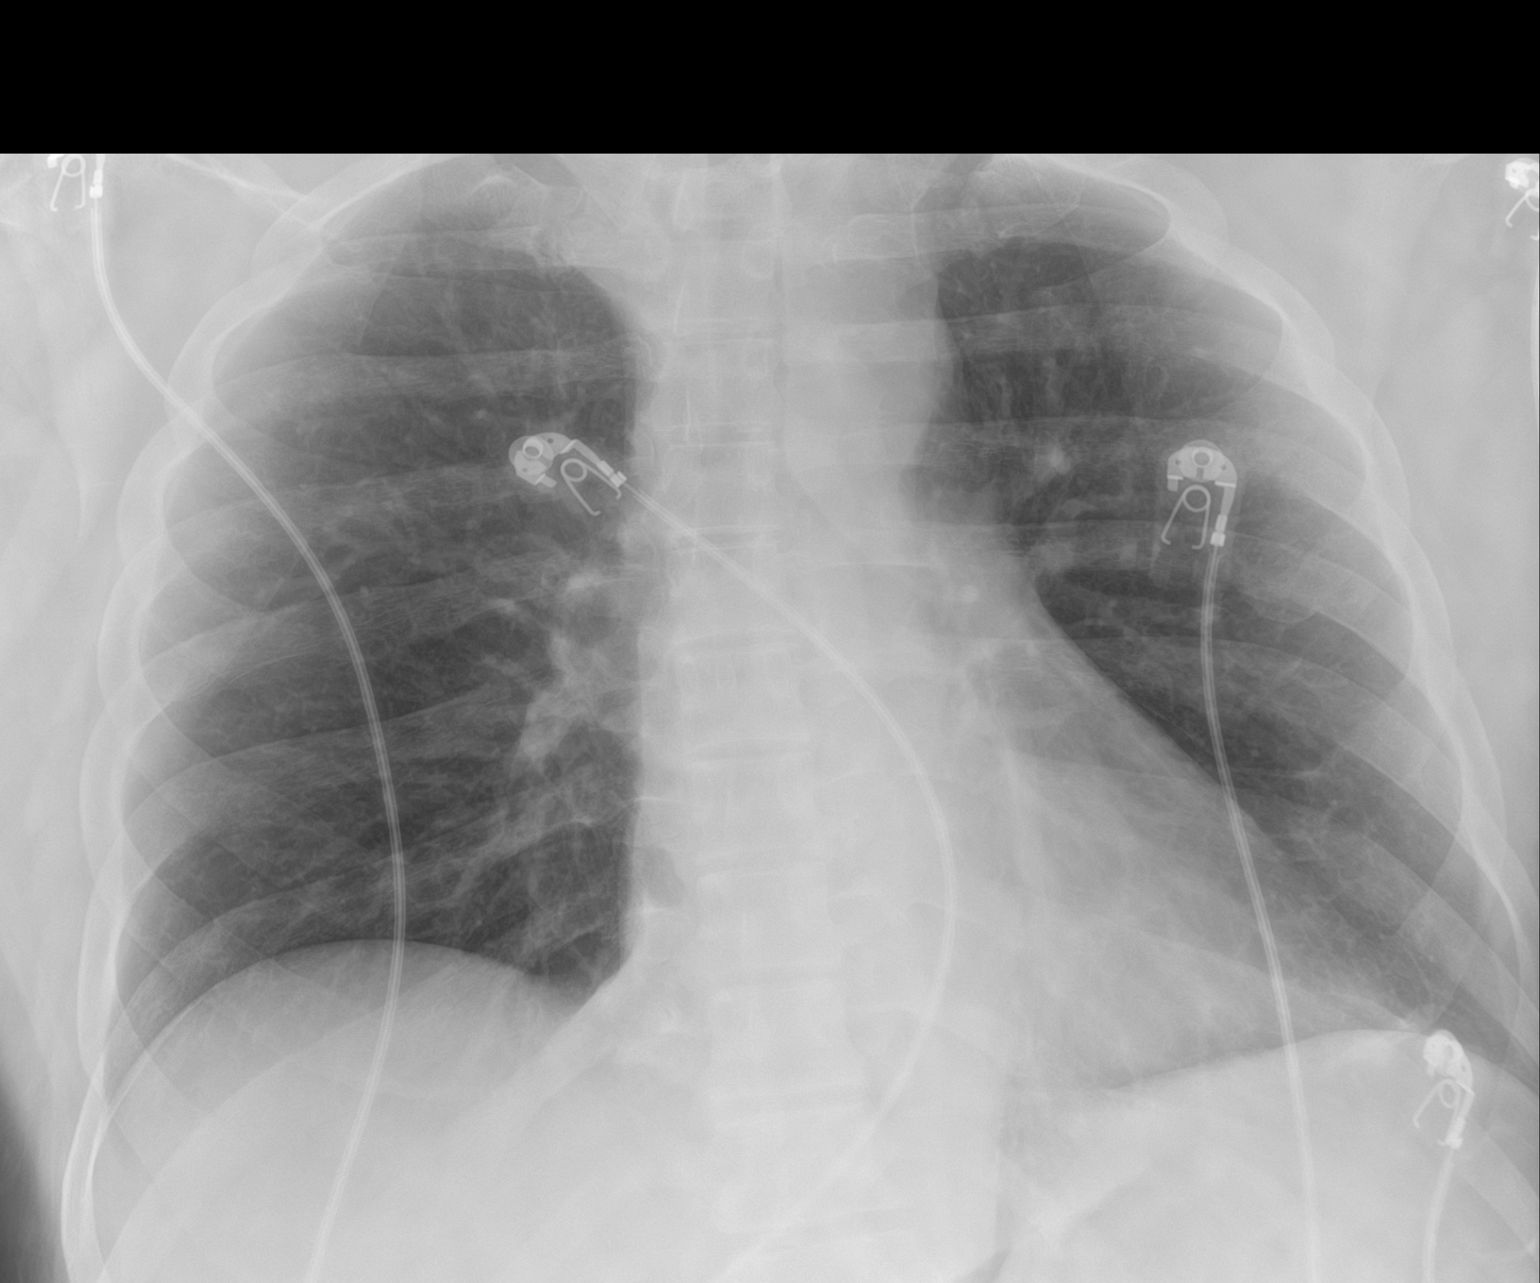

[chest ap (2 of 2)]
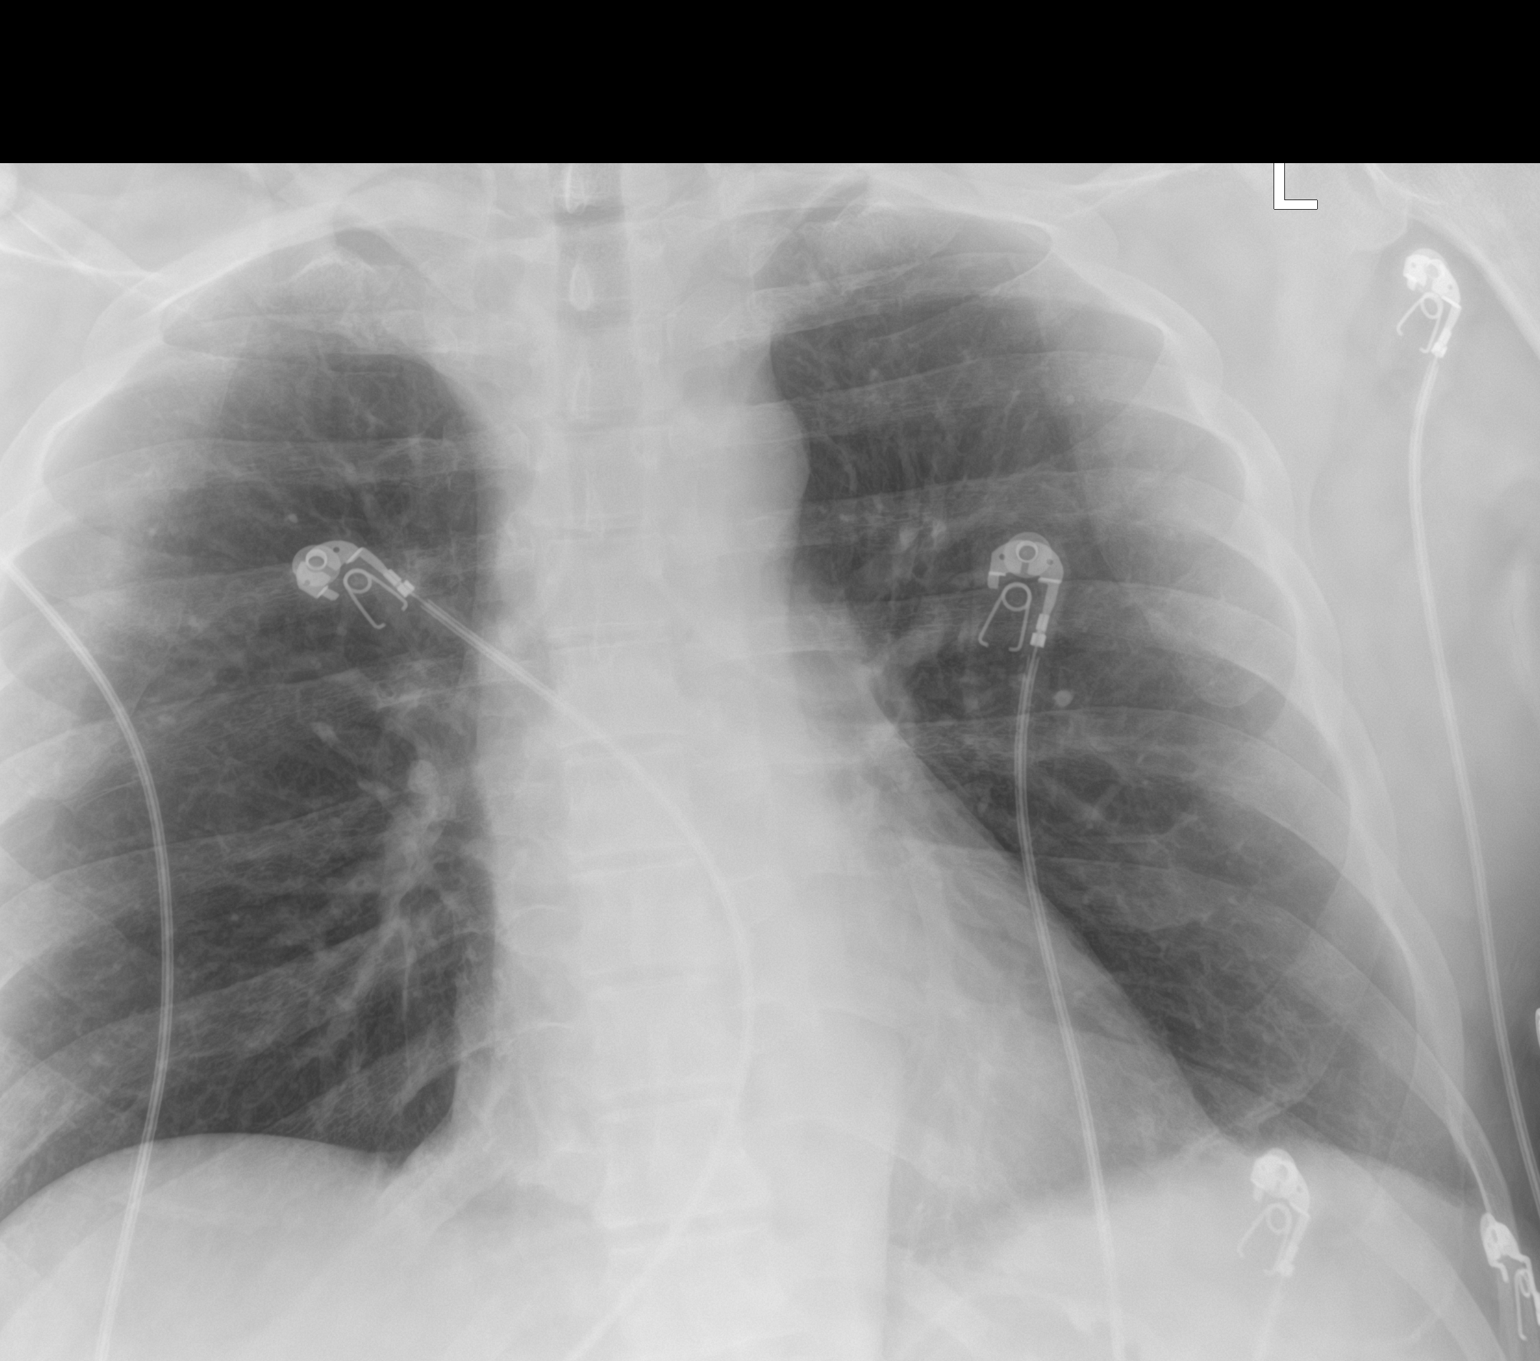

[2 of 2 positions shown; findings below may reference images not displayed]

FINDINGS: Normal heart size, mediastinal contours, and pulmonary vascularity.

Lungs clear.

No pulmonary infiltrate, pleural effusion, or pneumothorax.

Osseous structures unremarkable.
IMPRESSION: No acute abnormalities.
